# Patient Record
Sex: Male | Born: 1935 | Race: White | Hispanic: No | Marital: Married | Smoking: Former smoker
Health system: Southern US, Community
[De-identification: ages and names within clinical notes are randomized; demographics above are authoritative.]

## PROBLEM LIST (undated history)

## (undated) DIAGNOSIS — C859A Non-Hodgkin lymphoma, unspecified, in remission: Secondary | ICD-10-CM

## (undated) DIAGNOSIS — C8590 Non-Hodgkin lymphoma, unspecified, unspecified site: Secondary | ICD-10-CM

## (undated) DIAGNOSIS — E669 Obesity, unspecified: Secondary | ICD-10-CM

## (undated) DIAGNOSIS — C779 Secondary and unspecified malignant neoplasm of lymph node, unspecified: Secondary | ICD-10-CM

## (undated) DIAGNOSIS — M1990 Unspecified osteoarthritis, unspecified site: Secondary | ICD-10-CM

## (undated) DIAGNOSIS — G629 Polyneuropathy, unspecified: Secondary | ICD-10-CM

## (undated) DIAGNOSIS — H532 Diplopia: Secondary | ICD-10-CM

## (undated) DIAGNOSIS — Z923 Personal history of irradiation: Secondary | ICD-10-CM

## (undated) DIAGNOSIS — O368390 Maternal care for abnormalities of the fetal heart rate or rhythm, unspecified trimester, not applicable or unspecified: Secondary | ICD-10-CM

## (undated) DIAGNOSIS — H9190 Unspecified hearing loss, unspecified ear: Secondary | ICD-10-CM

## (undated) DIAGNOSIS — K620 Anal polyp: Secondary | ICD-10-CM

## (undated) DIAGNOSIS — H9319 Tinnitus, unspecified ear: Secondary | ICD-10-CM

## (undated) DIAGNOSIS — R42 Dizziness and giddiness: Secondary | ICD-10-CM

## (undated) DIAGNOSIS — K5790 Diverticulosis of intestine, part unspecified, without perforation or abscess without bleeding: Secondary | ICD-10-CM

## (undated) DIAGNOSIS — E7800 Pure hypercholesterolemia, unspecified: Secondary | ICD-10-CM

## (undated) DIAGNOSIS — C61 Malignant neoplasm of prostate: Secondary | ICD-10-CM

## (undated) HISTORY — DX: Polyneuropathy, unspecified: G62.9

## (undated) HISTORY — DX: Tinnitus, unspecified ear: H93.19

## (undated) HISTORY — DX: Obesity, unspecified: E66.9

## (undated) HISTORY — PX: OTHER SURGICAL HISTORY: SHX169

## (undated) HISTORY — DX: Diverticulosis of intestine, part unspecified, without perforation or abscess without bleeding: K57.90

## (undated) HISTORY — DX: Unspecified osteoarthritis, unspecified site: M19.90

## (undated) HISTORY — PX: CATARACT EXTRACTION: SUR2

## (undated) HISTORY — PX: TONSILLECTOMY: SUR1361

## (undated) HISTORY — DX: Anal polyp: K62.0

## (undated) HISTORY — DX: Personal history of irradiation: Z92.3

## (undated) HISTORY — DX: Maternal care for abnormalities of the fetal heart rate or rhythm, unspecified trimester, not applicable or unspecified: O36.8390

## (undated) HISTORY — PX: BACK SURGERY: SHX140

## (undated) HISTORY — DX: Diplopia: H53.2

## (undated) HISTORY — DX: Unspecified hearing loss, unspecified ear: H91.90

---

## 2000-04-06 ENCOUNTER — Ambulatory Visit (HOSPITAL_COMMUNITY): Admission: RE | Admit: 2000-04-06 | Discharge: 2000-04-06 | Payer: Self-pay | Admitting: *Deleted

## 2000-04-06 HISTORY — PX: COLONOSCOPY: SHX174

## 2005-04-05 ENCOUNTER — Emergency Department (HOSPITAL_COMMUNITY): Admission: EM | Admit: 2005-04-05 | Discharge: 2005-04-06 | Payer: Self-pay | Admitting: Emergency Medicine

## 2005-08-17 ENCOUNTER — Emergency Department (HOSPITAL_COMMUNITY): Admission: EM | Admit: 2005-08-17 | Discharge: 2005-08-17 | Payer: Self-pay | Admitting: Emergency Medicine

## 2007-08-21 ENCOUNTER — Encounter: Admission: RE | Admit: 2007-08-21 | Discharge: 2007-08-21 | Payer: Self-pay | Admitting: Internal Medicine

## 2009-08-18 ENCOUNTER — Ambulatory Visit: Payer: Self-pay | Admitting: Radiology

## 2009-08-18 ENCOUNTER — Emergency Department (HOSPITAL_BASED_OUTPATIENT_CLINIC_OR_DEPARTMENT_OTHER): Admission: EM | Admit: 2009-08-18 | Discharge: 2009-08-18 | Payer: Self-pay | Admitting: Emergency Medicine

## 2010-04-21 ENCOUNTER — Encounter
Admission: RE | Admit: 2010-04-21 | Discharge: 2010-04-21 | Payer: Self-pay | Source: Home / Self Care | Attending: Internal Medicine | Admitting: Internal Medicine

## 2010-08-07 NOTE — Procedures (Signed)
[place]. [facility]  Patient:    Darren Frank, Darren Frank                         MRN: [id number] Proc. Date: 04/06/00 Adm. Date:  [id number] Attending:  Sabino Gasser                           Procedure Report  PROCEDURE PERFORMED:  Colonoscopy.  ENDOSCOPIST:  Sabino Gasser, M.D.  INDICATIONS FOR PROCEDURE:  Colon cancer screening.  ANESTHESIA:  Demerol 70 mg, Versed 10 mg.  DESCRIPTION OF PROCEDURE:  With the patient mildly sedated in the left lateral decubitus position, a rectal exam was performed which was unremarkable. Subsequently the Olympus videoscopic colonoscope was inserted in the rectum and passed under direct vision into the cecum.  The cecum was identified by the ileocecal valve and appendiceal orifice, both of which were photographed. From this point, the colonoscope was slowly withdrawn, taking circumferential views of the entire colonic mucos, stopping in the sigmoid colon where diverticula were seen and more so than in the right colon, but were seen in both locations.  Photographs were taken in the sigmoid.  The endoscope was withdrawn to the rectum, which appeared normal on direct view and showed large internal hemorrhoids on retroflex view.  The endoscope was straightened and withdrawn.  Patients vital signs and pulse oximeter remained stable.  The patient tolerated the procedure well and without apparent complications.  FINDINGS:  Internal hemorrhoids, diverticulosis throughout colon, the left side more than right.  Otherwise unremarkable examination.  PLAN:  Follow up with me in five to 10 years or as needed. DD:  04/06/00 TD:  04/06/00 Job: [id number]

## 2010-08-07 NOTE — H&P (Signed)
NAME:  Darren, Frank NO.:  [id number]   MEDICAL RECORD NO.:  [id number]          PATIENT TYPE:  EMS   LOCATION:  ED                           FACILITY:  [facility]   PHYSICIAN:  Michaelyn Barter, M.D. DATE OF BIRTH:  [birth year]   DATE OF ADMISSION:  04/05/2005  DATE OF DISCHARGE:                                HISTORY & PHYSICAL   PRIMARY CARE PHYSICIAN:  Soyla Murphy. Renne Crigler, M.D.   CHIEF COMPLAINT:  Right hip swelling.   HISTORY OF PRESENT ILLNESS:  Darren Frank is a 77 year old male who states that  he was in the attic of his home earlier today when he fell through the roof  partially this morning.  He stated that he did not completely fall through  the roof.  However, his right leg did completely go through the roof.  He  managed to pull himself up and felt okay afterwards.  However shortly after  that, he went to clean his kitchen up and sometime later noticed that he had  began to develop a hematoma over the right lateral aspect of his thigh.  He  complains of only minimal pain at this particular time stating that certain  positions aggravate the pain.  Since the initial development of the  hematoma, it has continued to enlarge.  He therefore came to the ER for  further evaluation.  While waiting at the emergency departments waiting  department, he became diaphoretic, felt generalized pins and needles and  felt as though he was going to pass out.  He was then laid flat onto a  stretcher and given aggressive IV fluid hydration.  Currently, he states he  feels okay.  He has localized pain approximately 1-2/10 in intensity coming  from the area of the hematoma, no nausea, vomiting, fevers or chills, no  shortness of breath.   PAST MEDICAL HISTORY:  1.  Hyperlipidemia.  2.  Numbness in both feet currently being evaluated by Dr. Thad Ranger.  3.  High PSA treated by Dr. Isabel Caprice.  4.  Lymphoma in the left groin treated in the 1970s at [facility].  5.   Vertigo.  6.  Tinnitus.   PAST SURGICAL HISTORY:  Back surgery.   ALLERGIES:  No known drug allergies.   HOME MEDICATIONS:  1.  Multivitamin.  2. Aspirin 81 mg a day.  3. Vytorin.  4. Glucosamine.   FAMILY HISTORY:  Mother has a history of CVA and hypertension.  Father has a  history of Parkinson's disease.   SOCIAL HISTORY:  Cigarettes:  The patient stopped smoking back in the 1970s.  He smoked for approximately 20 years prior to that.  He smoked approximately  one pack per day.  Alcohol:  The patient drinks socially.   REVIEW OF SYSTEMS:  As per History of Present Illness, otherwise all other  symptoms are negative.   PHYSICAL EXAMINATION:  GENERAL:  The patient is currently awake.  He is  cooperative.  He does not appear to be in any obvious distress.  VITALS:  Blood pressure 113/65, heart  rate 54, respirations 20, oxygen  saturation is 97% on room air.  HEENT:  Anicteric.  Extraocular movements intact.  Oral mucosa is pink, no  thrush, no exudate.  NECK:  Supple, no lymphadenopathy.  Thyroid is not palpable.  No JVD.  CARDIAC:  S1, S2 is present, regular rate and rhythm.  No S3, S4, no murmur,  no gallops and no rubs.  RESPIRATORY:  Lungs are clear, no crackles, no wheezes.  ABDOMEN:  Soft, nontender, nondistended, positive bowel sounds x4 quadrants,  no masses palpable.  EXTREMITIES:  Trace distal leg edema of the right hip has a large hematoma  approximately 12 inches in length x 8 inches in width.  There is a large  abrasion that covers a large portion of the hematoma that is bright red.  NEUROLOGIC:  The patient is alert and oriented x3.  Cranial nerves II-XII  are intact.   LABORATORY DATA:  White blood cell count is 7.5, hemoglobin 13.9, hematocrit  40.2.  Platelet count 161.  Sodium 137, potassium 3.8, chloride 103, CO2 27,  BUN 14, creatinine 1.2, glucose 117, calcium 8.6.  X-ray of the right hip  reveals no acute findings.   ASSESSMENT/PLAN:  [name] is a  77 year old gentleman who sustained trauma  while working in his attic earlier today resulting in a hematoma on the  right aspect of his thigh.   1.  Right lateral thigh hematoma.  We will admit the patient for 23-hour      observation.  We will follow his H&Hs closely.  Continue aggressive IV      fluid hydration.  We will provide p.r.n. pain medication.  We will      consider transfusion if H&H starts to decline.  2.  History of hyperlipidemia.  We will continue previously prescribed home      medications.      Michaelyn Barter, M.D.  Electronically Signed     OR/MEDQ  D:  04/05/2005  T:  04/05/2005  Job:  [id number]   cc:   Soyla Murphy. Renne Crigler, M.D.  Fax: [phone]

## 2010-09-28 ENCOUNTER — Encounter: Payer: Self-pay | Admitting: *Deleted

## 2010-09-28 ENCOUNTER — Emergency Department (HOSPITAL_BASED_OUTPATIENT_CLINIC_OR_DEPARTMENT_OTHER)
Admission: EM | Admit: 2010-09-28 | Discharge: 2010-09-28 | Disposition: A | Discharge status: Home / Self Care | Payer: Medicare Other | Attending: Emergency Medicine | Admitting: Emergency Medicine

## 2010-09-28 DIAGNOSIS — R350 Frequency of micturition: Secondary | ICD-10-CM | POA: Insufficient documentation

## 2010-09-28 DIAGNOSIS — C779 Secondary and unspecified malignant neoplasm of lymph node, unspecified: Secondary | ICD-10-CM | POA: Insufficient documentation

## 2010-09-28 DIAGNOSIS — R300 Dysuria: Secondary | ICD-10-CM | POA: Insufficient documentation

## 2010-09-28 DIAGNOSIS — N390 Urinary tract infection, site not specified: Secondary | ICD-10-CM

## 2010-09-28 DIAGNOSIS — C61 Malignant neoplasm of prostate: Secondary | ICD-10-CM | POA: Insufficient documentation

## 2010-09-28 DIAGNOSIS — R319 Hematuria, unspecified: Secondary | ICD-10-CM | POA: Insufficient documentation

## 2010-09-28 HISTORY — DX: Secondary and unspecified malignant neoplasm of lymph node, unspecified: C77.9

## 2010-09-28 HISTORY — DX: Malignant neoplasm of prostate: C61

## 2010-09-28 LAB — URINE MICROSCOPIC-ADD ON

## 2010-09-28 LAB — URINALYSIS, ROUTINE W REFLEX MICROSCOPIC
Bilirubin Urine: NEGATIVE
Glucose, UA: NEGATIVE mg/dL
Protein, ur: >300 mg/dL — AB
Specific Gravity, Urine: 1.022 (ref 1.005–1.030)
Urobilinogen, UA: 0.2 mg/dL (ref 0.0–1.0)
pH: 6 (ref 5.0–8.0)

## 2010-09-28 MED ORDER — CIPROFLOXACIN HCL 500 MG PO TABS
500.0000 mg | ORAL_TABLET | Freq: Once | ORAL | Status: AC
  Administered 2010-09-28: 500 mg via ORAL
  Filled 2010-09-28: qty 1

## 2010-09-28 MED ORDER — CIPROFLOXACIN HCL 500 MG PO TABS: 500.0000 mg | ORAL_TABLET | Freq: Two times a day (BID) | ORAL | Status: AC

## 2010-09-28 NOTE — ED Notes (Signed)
Pt states that he has a hx of prostate ca and had markers placed in April pt returned for more testing on Thursday and is concerned that he has  UTI pt with hematuria painful burning sensation upon urination   And urinary frequency

## 2010-09-28 NOTE — ED Provider Notes (Signed)
History     Chief Complaint  Patient presents with  . Hematuria  . Dysuria  . Urinary Frequency   Patient is a 77 y.o. male presenting with hematuria, dysuria, and frequency. The history is provided by the patient.  Hematuria This is a new problem. Episode onset: four hours ago. He describes the hematuria as gross hematuria. The pain is moderate. Irritative symptoms include frequency and urgency. Obstructive symptoms include incomplete emptying and a slower stream. Associated symptoms include dysuria. Pertinent negatives include no abdominal pain, chills, fever, flank pain or vomiting.  Dysuria  Associated symptoms include frequency, hematuria and urgency. Pertinent negatives include no chills, no vomiting and no flank pain.  Urinary Frequency Pertinent negatives include no abdominal pain.    Past Medical History  Diagnosis Date  . Cancer   . Prostate cancer   . Lymph node cancer     History reviewed. No pertinent past surgical history.  History reviewed. No pertinent family history.  History  Substance Use Topics  . Smoking status: Never Smoker   . Smokeless tobacco: Not on file  . Alcohol Use:       Review of Systems  Constitutional: Negative for fever and chills.  Gastrointestinal: Negative for vomiting and abdominal pain.  Genitourinary: Positive for dysuria, urgency, frequency, hematuria and incomplete emptying. Negative for flank pain.  All other systems reviewed and are negative.    Physical Exam  BP 153/63  Pulse 68  Temp(Src) 98.7 F (37.1 C) (Oral)  Resp 16  Ht 6\' 2"  (1.88 m)  Wt 275 lb (124.739 kg)  BMI 35.31 kg/m2  SpO2 96%  Physical Exam  Nursing note and vitals reviewed. Constitutional: He appears well-developed and well-nourished.  HENT:  Head: Normocephalic and atraumatic.  Eyes: Conjunctivae and EOM are normal. Pupils are equal, round, and reactive to light.  Neck: Normal range of motion. Neck supple.  Cardiovascular: Normal rate and  regular rhythm.   Pulmonary/Chest: Effort normal and breath sounds normal.  Abdominal: Soft. He exhibits no distension and no mass. There is no tenderness. There is no CVA tenderness.  Genitourinary:       Urine grossly bloody  Musculoskeletal: Normal range of motion.       Trace edema of lower legs  Neurological: He is alert. Coordination normal.  Skin: Skin is warm and dry.    ED Course  Procedures  MDM Will treat for UTI. Advised increased fluid intake to prevent clots or obstruction.      Hanley Seamen, MD 09/28/10 [phone]

## 2010-09-29 LAB — URINE CULTURE: Culture  Setup Time: 201207091221

## 2010-10-09 ENCOUNTER — Ambulatory Visit: Payer: Medicare Other | Attending: Internal Medicine | Admitting: Rehabilitative and Restorative Service Providers"

## 2010-10-09 DIAGNOSIS — R269 Unspecified abnormalities of gait and mobility: Secondary | ICD-10-CM | POA: Insufficient documentation

## 2010-10-09 DIAGNOSIS — R42 Dizziness and giddiness: Secondary | ICD-10-CM | POA: Insufficient documentation

## 2010-10-09 DIAGNOSIS — IMO0001 Reserved for inherently not codable concepts without codable children: Secondary | ICD-10-CM | POA: Insufficient documentation

## 2011-04-03 ENCOUNTER — Encounter (HOSPITAL_BASED_OUTPATIENT_CLINIC_OR_DEPARTMENT_OTHER): Payer: Self-pay | Admitting: *Deleted

## 2011-04-03 ENCOUNTER — Inpatient Hospital Stay (HOSPITAL_BASED_OUTPATIENT_CLINIC_OR_DEPARTMENT_OTHER)
Admission: EM | Admit: 2011-04-03 | Discharge: 2011-04-05 | DRG: 864 | Disposition: A | Discharge status: Home / Self Care | Payer: Medicare Other | Attending: Family Medicine | Admitting: Family Medicine

## 2011-04-03 ENCOUNTER — Other Ambulatory Visit: Payer: Self-pay

## 2011-04-03 ENCOUNTER — Emergency Department (INDEPENDENT_AMBULATORY_CARE_PROVIDER_SITE_OTHER): Payer: Medicare Other

## 2011-04-03 DIAGNOSIS — Z79899 Other long term (current) drug therapy: Secondary | ICD-10-CM

## 2011-04-03 DIAGNOSIS — Z9849 Cataract extraction status, unspecified eye: Secondary | ICD-10-CM

## 2011-04-03 DIAGNOSIS — E7800 Pure hypercholesterolemia, unspecified: Secondary | ICD-10-CM | POA: Diagnosis present

## 2011-04-03 DIAGNOSIS — R05 Cough: Secondary | ICD-10-CM

## 2011-04-03 DIAGNOSIS — D709 Neutropenia, unspecified: Secondary | ICD-10-CM | POA: Diagnosis present

## 2011-04-03 DIAGNOSIS — R509 Fever, unspecified: Secondary | ICD-10-CM

## 2011-04-03 DIAGNOSIS — R5383 Other fatigue: Secondary | ICD-10-CM

## 2011-04-03 DIAGNOSIS — D696 Thrombocytopenia, unspecified: Secondary | ICD-10-CM | POA: Diagnosis present

## 2011-04-03 DIAGNOSIS — R5381 Other malaise: Secondary | ICD-10-CM | POA: Diagnosis present

## 2011-04-03 DIAGNOSIS — C8589 Other specified types of non-Hodgkin lymphoma, extranodal and solid organ sites: Secondary | ICD-10-CM | POA: Diagnosis present

## 2011-04-03 DIAGNOSIS — Z7982 Long term (current) use of aspirin: Secondary | ICD-10-CM

## 2011-04-03 DIAGNOSIS — T451X5A Adverse effect of antineoplastic and immunosuppressive drugs, initial encounter: Secondary | ICD-10-CM | POA: Diagnosis present

## 2011-04-03 DIAGNOSIS — I517 Cardiomegaly: Secondary | ICD-10-CM

## 2011-04-03 DIAGNOSIS — Z87891 Personal history of nicotine dependence: Secondary | ICD-10-CM

## 2011-04-03 DIAGNOSIS — C61 Malignant neoplasm of prostate: Secondary | ICD-10-CM | POA: Diagnosis present

## 2011-04-03 HISTORY — DX: Non-Hodgkin lymphoma, unspecified, in remission: C85.9A

## 2011-04-03 HISTORY — DX: Pure hypercholesterolemia, unspecified: E78.00

## 2011-04-03 HISTORY — DX: Dizziness and giddiness: R42

## 2011-04-03 HISTORY — DX: Non-Hodgkin lymphoma, unspecified, unspecified site: C85.90

## 2011-04-03 LAB — CK: Total CK: 172 U/L (ref 7–232)

## 2011-04-03 LAB — COMPREHENSIVE METABOLIC PANEL
ALT: 12 U/L (ref 0–53)
AST: 39 U/L — ABNORMAL HIGH (ref 0–37)
Alkaline Phosphatase: 38 U/L — ABNORMAL LOW (ref 39–117)
Chloride: 97 mEq/L (ref 96–112)
GFR calc non Af Amer: 80 mL/min — ABNORMAL LOW (ref >90)
Total Bilirubin: 0.7 mg/dL (ref 0.3–1.2)

## 2011-04-03 LAB — DIFFERENTIAL
Basophils Absolute: 0 10*3/uL (ref 0.0–0.1)
Basophils Relative: 0 % (ref 0–1)
Eosinophils Absolute: 0 10*3/uL (ref 0.0–0.7)
Lymphs Abs: 0.2 10*3/uL — ABNORMAL LOW (ref 0.7–4.0)
Monocytes Absolute: 0.2 10*3/uL (ref 0.1–1.0)
Monocytes Relative: 6 % (ref 3–12)
Neutro Abs: 2.4 10*3/uL (ref 1.7–7.7)

## 2011-04-03 LAB — CULTURE, BLOOD (ROUTINE X 2)
Culture: NO GROWTH
Culture: NO GROWTH

## 2011-04-03 LAB — URINE MICROSCOPIC-ADD ON

## 2011-04-03 LAB — URINALYSIS, ROUTINE W REFLEX MICROSCOPIC
Glucose, UA: NEGATIVE mg/dL
Leukocytes, UA: NEGATIVE
Specific Gravity, Urine: 1.029 (ref 1.005–1.030)
pH: 5.5 (ref 5.0–8.0)

## 2011-04-03 LAB — CBC
MCV: 90.3 fL (ref 78.0–100.0)
Platelets: 83 10*3/uL — ABNORMAL LOW (ref 150–400)

## 2011-04-03 LAB — URINE CULTURE
Colony Count: 10000
Culture  Setup Time: 201301131209

## 2011-04-03 LAB — LACTIC ACID, PLASMA: Lactic Acid, Venous: 1.4 mmol/L (ref 0.5–2.2)

## 2011-04-03 MED ORDER — OXYCODONE HCL 5 MG PO TABS: 5.0000 mg | ORAL_TABLET | ORAL | Status: DC | PRN

## 2011-04-03 MED ORDER — VANCOMYCIN HCL IN DEXTROSE 1-5 GM/200ML-% IV SOLN
1000.0000 mg | Freq: Once | INTRAVENOUS | Status: AC
  Administered 2011-04-03: 1000 mg via INTRAVENOUS
  Filled 2011-04-03: qty 200

## 2011-04-03 MED ORDER — ALUM & MAG HYDROXIDE-SIMETH 200-200-20 MG/5ML PO SUSP: 30.0000 mL | Freq: Four times a day (QID) | ORAL | Status: DC | PRN

## 2011-04-03 MED ORDER — ACETAMINOPHEN 500 MG PO TABS
1000.0000 mg | ORAL_TABLET | Freq: Once | ORAL | Status: AC
  Administered 2011-04-03: 1000 mg via ORAL
  Filled 2011-04-03: qty 2

## 2011-04-03 MED ORDER — ZOLPIDEM TARTRATE 5 MG PO TABS: 5.0000 mg | ORAL_TABLET | Freq: Every evening | ORAL | Status: DC | PRN

## 2011-04-03 MED ORDER — SODIUM CHLORIDE 0.9 % IV BOLUS (SEPSIS)
2000.0000 mL | Freq: Once | INTRAVENOUS | Status: AC
  Administered 2011-04-03: 2000 mL via INTRAVENOUS

## 2011-04-03 MED ORDER — HYDROMORPHONE HCL PF 1 MG/ML IJ SOLN: 0.5000 mg | INTRAMUSCULAR | Status: DC | PRN

## 2011-04-03 MED ORDER — PIPERACILLIN-TAZOBACTAM 3.375 G IVPB
3.3750 g | Freq: Three times a day (TID) | INTRAVENOUS | Status: DC
  Administered 2011-04-04 (×2): 3.375 g via INTRAVENOUS
  Filled 2011-04-03 (×4): qty 50

## 2011-04-03 MED ORDER — ONDANSETRON HCL 4 MG PO TABS: 4.0000 mg | ORAL_TABLET | Freq: Four times a day (QID) | ORAL | Status: DC | PRN

## 2011-04-03 MED ORDER — SODIUM CHLORIDE 0.9 % IV SOLN
INTRAVENOUS | Status: DC
  Administered 2011-04-04 – 2011-04-05 (×3): via INTRAVENOUS

## 2011-04-03 MED ORDER — SODIUM CHLORIDE 0.9 % IV SOLN
Freq: Once | INTRAVENOUS | Status: AC
  Administered 2011-04-03: 17:00:00 via INTRAVENOUS

## 2011-04-03 MED ORDER — ACETAMINOPHEN 650 MG RE SUPP: 650.0000 mg | Freq: Four times a day (QID) | RECTAL | Status: DC | PRN

## 2011-04-03 MED ORDER — SODIUM CHLORIDE 0.9 % IV SOLN: INTRAVENOUS | Status: AC

## 2011-04-03 MED ORDER — ONDANSETRON HCL 4 MG/2ML IJ SOLN: 4.0000 mg | Freq: Four times a day (QID) | INTRAMUSCULAR | Status: DC | PRN

## 2011-04-03 MED ORDER — ACETAMINOPHEN 325 MG PO TABS: 650.0000 mg | ORAL_TABLET | Freq: Four times a day (QID) | ORAL | Status: DC | PRN

## 2011-04-03 MED ORDER — PIPERACILLIN-TAZOBACTAM 3.375 G IVPB
3.3750 g | Freq: Once | INTRAVENOUS | Status: AC
  Administered 2011-04-03: 3.375 g via INTRAVENOUS
  Filled 2011-04-03: qty 50

## 2011-04-03 MED ORDER — VANCOMYCIN HCL IN DEXTROSE 1-5 GM/200ML-% IV SOLN
1000.0000 mg | Freq: Two times a day (BID) | INTRAVENOUS | Status: DC
  Administered 2011-04-04 (×2): 1000 mg via INTRAVENOUS
  Filled 2011-04-03 (×3): qty 200

## 2011-04-03 NOTE — ED Notes (Signed)
EMS transport from home- reported to ems he was having difficulty with his balance last night- hx of vertigo- today also c/o generalized weakness

## 2011-04-03 NOTE — ED Provider Notes (Signed)
History  This chart was scribed for Darren Sou, MD by Darren Frank. This patient was seen in room MH06/MH06 and the patient's care was started at 4:03PM.  CSN: [id number]  Arrival date & time 04/03/11  1534   First MD Initiated Contact with Patient 04/03/11 1559      Chief Complaint  Patient presents with  . Weakness    Patient is a 78 y.o. male presenting with weakness. The history is provided by the patient. No language interpreter was used.  Weakness The primary symptoms include fever. Primary symptoms do not include headaches, syncope, loss of consciousness, altered mental status, dizziness, loss of sensation, nausea or vomiting. The symptoms began yesterday. The symptoms are unchanged.  Additional symptoms include weakness. Additional symptoms do not include neck stiffness, pain, leg pain, loss of balance, tinnitus or vertigo.    Darren Frank is a 78 y.o. male brought in by ambulance, who presents to the Emergency Department complaining of gradual onset, non-changing, constant weakness that began yesterday. Pt states that he couldn't get out of his lounge chair yesterday and felt weakness while sitting up and attempting to stand. He reports that he got up today at 8:30AM and then slept until the paramedics arrived about 30 minutes prior to arrival. Per family, pt was wheezing this morning. Pt has a fever of 102.7 in the ED. Pt states that the symptoms are worsened by exertion and improved with rest. He did not take any medications to improve his symptoms. He denies cough, SOB,  vomiting, and diarrhea as associated symptoms. He has a h/o cancer and was treated over the summer last year for prostate cancer at [facility]. He denies smoking and is an occasional alcohol user.  PCP is Dr. Ander Frank.  Past Medical History  Diagnosis Date  . Cancer   . Prostate cancer   . Lymph node cancer   . Vertigo   . High cholesterol   . Lymphoma in remission     Past Surgical History    Procedure Date  . Cataract extraction     No family history on file.  History  Substance Use Topics  . Smoking status: Former Games developer  . Smokeless tobacco: Never Used  . Alcohol Use: Not on file  Pt drinks a glass of wine a week    Review of Systems  Constitutional: Positive for fever. Negative for diaphoresis.  HENT: Negative for sore throat, neck stiffness and tinnitus.   Respiratory: Negative for cough and shortness of breath.   Cardiovascular: Negative for chest pain and syncope.  Gastrointestinal: Negative for nausea, vomiting and diarrhea.  Genitourinary: Negative for dysuria and hematuria.  Musculoskeletal: Negative for back pain.  Skin: Negative for rash.  Neurological: Positive for weakness. Negative for dizziness, vertigo, loss of consciousness, light-headedness, headaches and loss of balance.  Psychiatric/Behavioral: Negative for altered mental status.  All other systems reviewed and are negative.    Allergies  Review of patient's allergies indicates no known allergies.  Home Medications   Current Outpatient Rx  Name Route Sig Dispense Refill  . VITAMIN D 2000 UNITS PO TABS Oral Take 2,000 Units by mouth daily.    Marland Kitchen GLUCOSAMINE HCL 1500 MG PO TABS Oral Take 3,000 mg by mouth daily.    . ADULT MULTIVITAMIN W/MINERALS CH Oral Take 1 tablet by mouth daily.    Marland Kitchen FISH OIL TRIPLE STRENGTH 1400 MG PO CAPS Oral Take 1 capsule by mouth daily.    Marland Kitchen TAMSULOSIN HCL 0.4 MG PO CAPS  Oral Take 0.4 mg by mouth.    . ASPIRIN 81 MG PO TABS Oral Take 81 mg by mouth daily.      Marland Kitchen EZETIMIBE 10 MG PO TABS Oral Take 10 mg by mouth daily.      Marland Kitchen ROSUVASTATIN CALCIUM 20 MG PO TABS Oral Take 20 mg by mouth daily.        Triage Vitals: BP 152/51  Pulse 77  Temp(Src) 102.7 F (39.3 C) (Oral)  Resp 20  Ht 6\' 2"  (1.88 m)  Wt 275 lb (124.739 kg)  BMI 35.31 kg/m2  SpO2 95%  Physical Exam  Nursing note and vitals reviewed. Constitutional: He is oriented to person, place, and time.  He appears well-developed and well-nourished.  HENT:  Head: Normocephalic and atraumatic.       Dry mucous membranes  Eyes: Conjunctivae and EOM are normal.  Neck: Neck supple.       No bruit  Pulmonary/Chest: Effort normal and breath sounds normal. No respiratory distress.  Abdominal: Soft. There is no tenderness.       Ventral hernia  Genitourinary: Prostate normal and penis normal.  Musculoskeletal: Normal range of motion. He exhibits no edema (no leg swelling).       Weak upon sitting up  Lymphadenopathy:    He has no cervical adenopathy.  Neurological: He is alert and oriented to person, place, and time. No cranial nerve deficit.  Skin: Skin is warm and dry. No rash noted.  Psychiatric: He has a normal mood and affect. His behavior is normal.    ED Course  Procedures (including critical care time)  DIAGNOSTIC STUDIES: Oxygen Saturation is 95% on room air, adequate by my interpretation.    COORDINATION OF CARE: 4:07PM-Advised pt that he will be admitted and he agreed to admission. Discussed treatment plan with pt at bedside and pt agreed to plan.   Date: 04/03/2011  Rate: 75  Rhythm: normal sinus rhythm  QRS Axis: normal  Intervals: normal  ST/T Wave abnormalities: normal  Conduction Disutrbances:none  Narrative Interpretation:   Old EKG Reviewed: none available Diffuse low-voltage QRS complexes  No diagnosis found. Results for orders placed during the hospital encounter of 04/03/11  CBC      Component Value Range   WBC 2.8 (*) 4.0 - 10.5 (K/uL)   RBC 4.53  4.22 - 5.81 (MIL/uL)   Hemoglobin 14.0  13.0 - 17.0 (g/dL)   HCT 60.4  54.0 - 98.1 (%)   MCV 90.3  78.0 - 100.0 (fL)   MCH 30.9  26.0 - 34.0 (pg)   MCHC 34.2  30.0 - 36.0 (g/dL)   RDW 19.1  47.8 - 29.5 (%)   Platelets 83 (*) 150 - 400 (K/uL)  DIFFERENTIAL      Component Value Range   Neutrophils Relative 87 (*) 43 - 77 (%)   Neutro Abs 2.4  1.7 - 7.7 (K/uL)   Lymphocytes Relative 7 (*) 12 - 46 (%)    Lymphs Abs 0.2 (*) 0.7 - 4.0 (K/uL)   Monocytes Relative 6  3 - 12 (%)   Monocytes Absolute 0.2  0.1 - 1.0 (K/uL)   Eosinophils Relative 1  0 - 5 (%)   Eosinophils Absolute 0.0  0.0 - 0.7 (K/uL)   Basophils Relative 0  0 - 1 (%)   Basophils Absolute 0.0  0.0 - 0.1 (K/uL)   RBC Morphology POLYCHROMASIA PRESENT    COMPREHENSIVE METABOLIC PANEL      Component Value Range  Sodium 134 (*) 135 - 145 (mEq/L)   Potassium 4.1  3.5 - 5.1 (mEq/L)   Chloride 97  96 - 112 (mEq/L)   CO2 26  19 - 32 (mEq/L)   Glucose, Bld 103 (*) 70 - 99 (mg/dL)   BUN 14  6 - 23 (mg/dL)   Creatinine, Ser 1.61  0.50 - 1.35 (mg/dL)   Calcium 9.0  8.4 - 09.6 (mg/dL)   Total Protein 7.2  6.0 - 8.3 (g/dL)   Albumin 4.0  3.5 - 5.2 (g/dL)   AST 39 (*) 0 - 37 (U/L)   ALT 12  0 - 53 (U/L)   Alkaline Phosphatase 38 (*) 39 - 117 (U/L)   Total Bilirubin 0.7  0.3 - 1.2 (mg/dL)   GFR calc non Af Amer 80 (*) >90 (mL/min)   GFR calc Af Amer >90  >90 (mL/min)  URINALYSIS, ROUTINE W REFLEX MICROSCOPIC      Component Value Range   Color, Urine AMBER (*) YELLOW    APPearance CLOUDY (*) CLEAR    Specific Gravity, Urine 1.029  1.005 - 1.030    pH 5.5  5.0 - 8.0    Glucose, UA NEGATIVE  NEGATIVE (mg/dL)   Hgb urine dipstick MODERATE (*) NEGATIVE    Bilirubin Urine NEGATIVE  NEGATIVE    Ketones, ur NEGATIVE  NEGATIVE (mg/dL)   Protein, ur 045 (*) NEGATIVE (mg/dL)   Urobilinogen, UA 0.2  0.0 - 1.0 (mg/dL)   Nitrite NEGATIVE  NEGATIVE    Leukocytes, UA NEGATIVE  NEGATIVE   LACTIC ACID, PLASMA      Component Value Range   Lactic Acid, Venous 1.4  0.5 - 2.2 (mmol/L)  URINE MICROSCOPIC-ADD ON      Component Value Range   Squamous Epithelial / LPF RARE  RARE    WBC, UA 3-6  <3 (WBC/hpf)   RBC / HPF 7-10  <3 (RBC/hpf)   Bacteria, UA MANY (*) RARE    Urine-Other MUCOUS PRESENT     Dg Chest Port 1 View  04/03/2011  *RADIOLOGY REPORT*  Clinical Data: Weakness.  Cough.  Fever.  PORTABLE CHEST - 1 VIEW  Comparison: 08/18/2009   Findings: Remote right rib trauma.  Mild cardiomegaly.  Left hemidiaphragm elevation persists.  No right and no definite left pleural effusion. No pneumothorax.  Right lung is clear.  Vague opacity at the left lung base.  IMPRESSION:  1.  Cardiomegaly with low lung volumes and left hemidiaphragm elevation. 2.  Vague density along the periphery of the left hemidiaphragm. Minimal airspace disease and/or pleural fluid cannot be entirely excluded.  Especially if there is a concern of left lower lobe pneumonia, consider PA and lateral radiographs.  Original Report Authenticated By: Consuello Bossier, M.D.     Patient becomes generally weak upon sitting up in bed  6:05 PM patient alert awake appears comfortable feels improved after treatment with intavenouvenous fluids and antibiotics  MDM  Plan IV fluids pan culture antibiotics Admission Assessment doubt pneumonia no cough lungs clear auscultation   Spoke with dr Sharl Ma who accepts pt in transfer to [facility] Team [facility] , med surg flood 23 hour obs  diagnosis #1 febrile illness #2 weakness #3 leukopenia #4 thrombocytopenia  I personally performed the services described in this documentation, which was scribed in my presence. The recorded information has been reviewed and considered.  Darren Sou, MD 04/03/11 [phone]

## 2011-04-03 NOTE — H&P (Addendum)
DATE OF ADMISSION:  04/03/2011  PCP:    Londell Moh, MD, MD   Chief Complaint: Weakness    HPI: Darren Frank is an 78 y.o. male who was seen at the [facility] ED for worsening weakness over the past 3 days. Patient reports low grade fevers and chills but he denies cough and myalgias.  He reports not being able to get out of his chair today and  Having difficulty walking dur to weakness.  He denies SOB or Chest Pain. He also denies having nausea or vomiting, but he reports having a decreased appetite.  Patient has a remote history of Lymphoma treated with chemo therapy in the 1970's, and a recent history of prostate cancer which was treated with radiation treatment at [facility] and was completed in 12/2010.  Patient reports having the flu vaccine 1 month ago.    Past Medical History  Diagnosis Date  . Cancer   . Prostate cancer   . Lymph node cancer   . Vertigo   . High cholesterol   . Lymphoma in remission     Past Surgical History  Procedure Date  . Cataract extraction     Medications:  HOME MEDS: Prior to Admission medications   Medication Sig Start Date End Date Taking? Authorizing Provider  aspirin 81 MG tablet Take 81 mg by mouth daily.     Yes Historical Provider, MD  Cholecalciferol (VITAMIN D) 2000 UNITS tablet Take 2,000 Units by mouth daily.   Yes Historical Provider, MD  ezetimibe (ZETIA) 10 MG tablet Take 10 mg by mouth daily.     Yes Historical Provider, MD  Glucosamine HCl (GLUCOSAMINE MAXIMUM STRENGTH) 1500 MG TABS Take 3,000 mg by mouth daily.   Yes Historical Provider, MD  Multiple Vitamin (MULITIVITAMIN WITH MINERALS) TABS Take 1 tablet by mouth daily.   Yes Historical Provider, MD  naproxen sodium (ANAPROX) 220 MG tablet Take 220 mg by mouth 2 (two) times daily with a meal. For back pain   Yes Historical Provider, MD  Omega-3 Fatty Acids (FISH OIL TRIPLE STRENGTH) 1400 MG CAPS Take 2 capsules by mouth daily.    Yes  Historical Provider, MD  rosuvastatin (CRESTOR) 20 MG tablet Take 20 mg by mouth daily.     Yes Historical Provider, MD  Tamsulosin HCl (FLOMAX) 0.4 MG CAPS Take 0.4 mg by mouth.   Yes Historical Provider, MD    Allergies:  No Known Allergies  Social History:   reports that he has quit smoking. He has never used smokeless tobacco. He reports that he does not use illicit drugs. His alcohol history not on file.  Family History: No family history on file.  Review of Systems:  The patient denies weight loss, vision loss, decreased hearing, hoarseness, chest pain, syncope, dyspnea on exertion, peripheral edema, balance deficits, hemoptysis, abdominal pain, melena, hematochezia, severe indigestion/heartburn, hematuria, incontinence, genital sores, muscle, suspicious skin lesions, transient blindness, depression, unusual weight change, abnormal bleeding, enlarged lymph nodes, angioedema, and breast masses.   Physical Exam:  GEN:  Pleasant  Elderly Obese 78 year old Caucasian male examined and in no acute distress; cooperative with exam Filed Vitals:   04/03/11 1713 04/03/11 1808 04/03/11 1938 04/03/11 2051  BP: 160/55 130/60 141/87   Pulse: 71 63 67   Temp: 101.4 F (38.6 C)  99.3 F (37.4 C)   TempSrc: Oral  Oral   Resp: 18 20 16    Height:      Weight:  130.409 kg (287 lb 8 oz)  SpO2: 98% 97% 97%    Blood pressure 141/87, pulse 67, temperature 99.3 F (37.4 C), temperature source Oral, resp. rate 16, height 6\' 2"  (1.88 m), weight 130.409 kg (287 lb 8 oz), SpO2 97.00%. PSYCH:  He is alert and oriented x4; does not appear anxious does not appear depressed; affect is normal HEENT: Normocephalic and Atraumatic, Mucous membranes pink; PERRLA; EOM intact; Fundi:  Benign;  No scleral icterus, Nares: Patent, Oropharynx: Clear, Neck:  FROM, no cervical lymphadenopathy nor thyromegaly or carotid bruit; no JVD; Breasts:: Not examined CHEST WALL: No tenderness CHEST: Normal respiration, clear  to auscultation bilaterally HEART: Regular rate and rhythm; no murmurs rubs or gallops BACK: No kyphosis or scoliosis; no CVA tenderness ABDOMEN: Positive Bowel Sounds, Obese, soft non-tender; no masses, no organomegaly. Rectal Exam: Not done EXTREMITIES: no cyanosis, clubbing or edema; no ulcerations. Genitalia: not examined PULSES: 2+ and symmetric SKIN: Normal hydration no rash or ulceration CNS: Cranial nerves 2-12 grossly intact no focal neurologic deficit, Gait not assessed.     Labs & Imaging Results for orders placed during the hospital encounter of 04/03/11 (from the past 48 hour(s))  CBC     Status: Abnormal   Collection Time   04/03/11  4:10 PM      Component Value Range Comment   WBC 2.8 (*) 4.0 - 10.5 (K/uL)    RBC 4.53  4.22 - 5.81 (MIL/uL)    Hemoglobin 14.0  13.0 - 17.0 (g/dL)    HCT 91.4  78.2 - 95.6 (%)    MCV 90.3  78.0 - 100.0 (fL)    MCH 30.9  26.0 - 34.0 (pg)    MCHC 34.2  30.0 - 36.0 (g/dL)    RDW 21.3  08.6 - 57.8 (%)    Platelets 83 (*) 150 - 400 (K/uL) PLATELET COUNT CONFIRMED BY SMEAR  DIFFERENTIAL     Status: Abnormal   Collection Time   04/03/11  4:10 PM      Component Value Range Comment   Neutrophils Relative 87 (*) 43 - 77 (%)    Neutro Abs 2.4  1.7 - 7.7 (K/uL)    Lymphocytes Relative 7 (*) 12 - 46 (%)    Lymphs Abs 0.2 (*) 0.7 - 4.0 (K/uL)    Monocytes Relative 6  3 - 12 (%)    Monocytes Absolute 0.2  0.1 - 1.0 (K/uL)    Eosinophils Relative 1  0 - 5 (%)    Eosinophils Absolute 0.0  0.0 - 0.7 (K/uL)    Basophils Relative 0  0 - 1 (%)    Basophils Absolute 0.0  0.0 - 0.1 (K/uL)    RBC Morphology POLYCHROMASIA PRESENT   BASOPHILIC STIPPLING  COMPREHENSIVE METABOLIC PANEL     Status: Abnormal   Collection Time   04/03/11  4:10 PM      Component Value Range Comment   Sodium 134 (*) 135 - 145 (mEq/L)    Potassium 4.1  3.5 - 5.1 (mEq/L)    Chloride 97  96 - 112 (mEq/L)    CO2 26  19 - 32 (mEq/L)    Glucose, Bld 103 (*) 70 - 99 (mg/dL)    BUN  14  6 - 23 (mg/dL)    Creatinine, Ser 4.69  0.50 - 1.35 (mg/dL)    Calcium 9.0  8.4 - 10.5 (mg/dL)    Total Protein 7.2  6.0 - 8.3 (g/dL)    Albumin 4.0  3.5 -  5.2 (g/dL)    AST 39 (*) 0 - 37 (U/L)    ALT 12  0 - 53 (U/L)    Alkaline Phosphatase 38 (*) 39 - 117 (U/L)    Total Bilirubin 0.7  0.3 - 1.2 (mg/dL)    GFR calc non Af Amer 80 (*) >90 (mL/min)    GFR calc Af Amer >90  >90 (mL/min)   CK     Status: Normal   Collection Time   04/03/11  4:10 PM      Component Value Range Comment   Total CK 172  7 - 232 (U/L)   LACTIC ACID, PLASMA     Status: Normal   Collection Time   04/03/11  4:45 PM      Component Value Range Comment   Lactic Acid, Venous 1.4  0.5 - 2.2 (mmol/L)   URINALYSIS, ROUTINE W REFLEX MICROSCOPIC     Status: Abnormal   Collection Time   04/03/11  5:02 PM      Component Value Range Comment   Color, Urine AMBER (*) YELLOW  BIOCHEMICALS MAY BE AFFECTED BY COLOR   APPearance CLOUDY (*) CLEAR     Specific Gravity, Urine 1.029  1.005 - 1.030     pH 5.5  5.0 - 8.0     Glucose, UA NEGATIVE  NEGATIVE (mg/dL)    Hgb urine dipstick MODERATE (*) NEGATIVE     Bilirubin Urine NEGATIVE  NEGATIVE     Ketones, ur NEGATIVE  NEGATIVE (mg/dL)    Protein, ur 161 (*) NEGATIVE (mg/dL)    Urobilinogen, UA 0.2  0.0 - 1.0 (mg/dL)    Nitrite NEGATIVE  NEGATIVE     Leukocytes, UA NEGATIVE  NEGATIVE    URINE MICROSCOPIC-ADD ON     Status: Abnormal   Collection Time   04/03/11  5:02 PM      Component Value Range Comment   Squamous Epithelial / LPF RARE  RARE     WBC, UA 3-6  <3 (WBC/hpf)    RBC / HPF 7-10  <3 (RBC/hpf)    Bacteria, UA MANY (*) RARE     Urine-Other MUCOUS PRESENT   SPERM PRESENT   Dg Chest Port 1 View  04/03/2011  *RADIOLOGY REPORT*  Clinical Data: Weakness.  Cough.  Fever.  PORTABLE CHEST - 1 VIEW  Comparison: 08/18/2009  Findings: Remote right rib trauma.  Mild cardiomegaly.  Left hemidiaphragm elevation persists.  No right and no definite left pleural effusion. No  pneumothorax.  Right lung is clear.  Vague opacity at the left lung base.  IMPRESSION:  1.  Cardiomegaly with low lung volumes and left hemidiaphragm elevation. 2.  Vague density along the periphery of the left hemidiaphragm. Minimal airspace disease and/or pleural fluid cannot be entirely excluded.  Especially if there is a concern of left lower lobe pneumonia, consider PA and lateral radiographs.  Original Report Authenticated By: Consuello Bossier, M.D.    Assessment/Plan: 1.  Weakness 2.  Fever 3.  Pneumonia LLL 4.  Neutropenia 5.  Thrombocytopenia 6.  Prostate Cancer History 7.  Remote History of Lymphoma    Plan:  Patient has been placed on Droplet precautions due to possible Pneumonia and also because of his neutropenia.  Broad Spectrum Antibiotic coverage has also been initaited as well with IV Vancomycn and Zosyn.  A repeat CXR will be performed in the AM, a PA+Lateral CXR for further evaluation of a Possible LLL Pneumonia.  Further imaging will be considered by the  primary rounding team if needed. SCDs have been ordered for  DVT prophylaxis.  Patients CPK levels are not elevated at this time but a recheck will be done in the AM.  His Statin medications will continue unless it is decided by the primary rounding team to discontinue because of his weakness.  Neurologic checks have also been ordered to evaluate for progression of the patients weakness, as well as continuous pulse oximetry to monitor for desaturation in the event patient may be developing GBS (Guilaain Barre Syndrome).  Other plans as per orders.    CODE STATUS:      FULL CODE         Davell Beckstead C 04/03/2011, 10:41 PM

## 2011-04-03 NOTE — ED Provider Notes (Signed)
History     CSN: [id number]  Arrival date & time 04/03/11  1534   First MD Initiated Contact with Patient 04/03/11 1559      Chief Complaint  Patient presents with  . Weakness    (Consider location/radiation/quality/duration/timing/severity/associated sxs/prior treatment) HPI  Past Medical History  Diagnosis Date  . Cancer   . Prostate cancer   . Lymph node cancer   . Vertigo   . High cholesterol   . Lymphoma in remission     Past Surgical History  Procedure Date  . Cataract extraction     No family history on file.  History  Substance Use Topics  . Smoking status: Former Games developer  . Smokeless tobacco: Never Used  . Alcohol Use: Not on file      Review of Systems  Allergies  Review of patient's allergies indicates no known allergies.  Home Medications   Current Outpatient Rx  Name Route Sig Dispense Refill  . VITAMIN D 2000 UNITS PO TABS Oral Take 2,000 Units by mouth daily.    Marland Kitchen GLUCOSAMINE HCL 1500 MG PO TABS Oral Take 3,000 mg by mouth daily.    . ADULT MULTIVITAMIN W/MINERALS CH Oral Take 1 tablet by mouth daily.    Marland Kitchen FISH OIL TRIPLE STRENGTH 1400 MG PO CAPS Oral Take 1 capsule by mouth daily.    Marland Kitchen TAMSULOSIN HCL 0.4 MG PO CAPS Oral Take 0.4 mg by mouth.    . ASPIRIN 81 MG PO TABS Oral Take 81 mg by mouth daily.      Marland Kitchen EZETIMIBE 10 MG PO TABS Oral Take 10 mg by mouth daily.      Marland Kitchen ROSUVASTATIN CALCIUM 20 MG PO TABS Oral Take 20 mg by mouth daily.        BP 152/51  Pulse 77  Temp(Src) 102.7 F (39.3 C) (Oral)  Resp 20  Ht 6\' 2"  (1.88 m)  Wt 275 lb (124.739 kg)  BMI 35.31 kg/m2  SpO2 95%  Physical Exam  ED Course  Procedures (including critical care time)  Labs Reviewed - No data to display No results found.   No diagnosis found.    MDM  Duplicate note        [name], MD 04/04/11 1308

## 2011-04-03 NOTE — Progress Notes (Signed)
ANTIBIOTIC CONSULT NOTE - INITIAL  Pharmacy Consult for Vancomycin and Zosyn Indication: PNA  No Known Allergies  Patient Measurements: Height: 6\' 2"  (188 cm) Weight: 287 lb 8 oz (130.409 kg) (wt  in  bed) IBW/kg (Calculated) : 82.2    Vital Signs: Temp: 99.3 F (37.4 C) (01/12 1938) Temp src: Oral (01/12 1938) BP: 141/87 mmHg (01/12 1938) Pulse Rate: 67  (01/12 1938)  Labs:  Basename 04/03/11 1610  WBC 2.8*  HGB 14.0  PLT 83*  LABCREA --  CREATININE 0.90   Estimated Creatinine Clearance: 98.7 ml/min (by C-G formula based on Cr of 0.9). No results found for this basename: VANCOTROUGH:2,VANCOPEAK:2,VANCORANDOM:2,GENTTROUGH:2,GENTPEAK:2,GENTRANDOM:2,TOBRATROUGH:2,TOBRAPEAK:2,TOBRARND:2,AMIKACINPEAK:2,AMIKACINTROU:2,AMIKACIN:2, in the last 72 hours   Medical History: Past Medical History  Diagnosis Date  . Cancer   . Prostate cancer   . Lymph node cancer   . Vertigo   . High cholesterol   . Lymphoma in remission     Medications:  Prescriptions prior to admission  Medication Sig Dispense Refill  . aspirin 81 MG tablet Take 81 mg by mouth daily.        . Cholecalciferol (VITAMIN D) 2000 UNITS tablet Take 2,000 Units by mouth daily.      Marland Kitchen ezetimibe (ZETIA) 10 MG tablet Take 10 mg by mouth daily.        . Glucosamine HCl (GLUCOSAMINE MAXIMUM STRENGTH) 1500 MG TABS Take 3,000 mg by mouth daily.      . Multiple Vitamin (MULITIVITAMIN WITH MINERALS) TABS Take 1 tablet by mouth daily.      . naproxen sodium (ANAPROX) 220 MG tablet Take 220 mg by mouth 2 (two) times daily with a meal. For back pain      . Omega-3 Fatty Acids (FISH OIL TRIPLE STRENGTH) 1400 MG CAPS Take 2 capsules by mouth daily.       . rosuvastatin (CRESTOR) 20 MG tablet Take 20 mg by mouth daily.        . Tamsulosin HCl (FLOMAX) 0.4 MG CAPS Take 0.4 mg by mouth.       Assessment: 78 yo male with weakness/fever, poss PNA for empiric antibiotics  Vancomycin 1 g IV given at [facility] at 6 pm  Goal of  Therapy:  Vancomycin trough level 15-20 mcg/ml  Plan:  Vancomycin 1 g IV now, then 1 g IV q12h Zosyn 3.375 g IV q8h  Asiah Befort, Gary Fleet 04/03/2011,11:37 PM

## 2011-04-03 NOTE — ED Notes (Signed)
EKG given to EDP Jacubowitz- no old ekg available

## 2011-04-04 ENCOUNTER — Encounter (HOSPITAL_COMMUNITY): Payer: Self-pay | Admitting: *Deleted

## 2011-04-04 ENCOUNTER — Observation Stay (HOSPITAL_COMMUNITY): Payer: Medicare Other

## 2011-04-04 ENCOUNTER — Other Ambulatory Visit: Payer: Self-pay

## 2011-04-04 DIAGNOSIS — D709 Neutropenia, unspecified: Secondary | ICD-10-CM | POA: Diagnosis present

## 2011-04-04 DIAGNOSIS — R509 Fever, unspecified: Secondary | ICD-10-CM | POA: Diagnosis present

## 2011-04-04 LAB — INFLUENZA PANEL BY PCR (TYPE A & B)
H1N1 flu by pcr: NOT DETECTED
Influenza A By PCR: NEGATIVE
Influenza B By PCR: NEGATIVE

## 2011-04-04 LAB — LEGIONELLA ANTIGEN, URINE

## 2011-04-04 LAB — CBC
HCT: 36.6 % — ABNORMAL LOW (ref 39.0–52.0)
Hemoglobin: 12.5 g/dL — ABNORMAL LOW (ref 13.0–17.0)
RBC: 3.96 MIL/uL — ABNORMAL LOW (ref 4.22–5.81)
WBC: 2.5 10*3/uL — ABNORMAL LOW (ref 4.0–10.5)

## 2011-04-04 LAB — BASIC METABOLIC PANEL
BUN: 15 mg/dL (ref 6–23)
Chloride: 102 mEq/L (ref 96–112)
GFR calc Af Amer: 76 mL/min — ABNORMAL LOW (ref >90)
Potassium: 4.3 mEq/L (ref 3.5–5.1)
Sodium: 137 mEq/L (ref 135–145)

## 2011-04-04 MED ORDER — LEVOFLOXACIN IN D5W 500 MG/100ML IV SOLN
500.0000 mg | INTRAVENOUS | Status: DC
  Administered 2011-04-04: 500 mg via INTRAVENOUS
  Filled 2011-04-04 (×2): qty 100

## 2011-04-04 MED ORDER — GLUCOSAMINE HCL 1500 MG PO TABS: 3000.0000 mg | ORAL_TABLET | Freq: Every day | ORAL | Status: DC

## 2011-04-04 MED ORDER — ROSUVASTATIN CALCIUM 20 MG PO TABS
20.0000 mg | ORAL_TABLET | Freq: Every day | ORAL | Status: DC
  Administered 2011-04-04 – 2011-04-05 (×2): 20 mg via ORAL
  Filled 2011-04-04 (×2): qty 1

## 2011-04-04 MED ORDER — TAMSULOSIN HCL 0.4 MG PO CAPS
0.4000 mg | ORAL_CAPSULE | Freq: Every day | ORAL | Status: DC
  Administered 2011-04-04 – 2011-04-05 (×2): 0.4 mg via ORAL
  Filled 2011-04-04 (×2): qty 1

## 2011-04-04 MED ORDER — EZETIMIBE 10 MG PO TABS
10.0000 mg | ORAL_TABLET | Freq: Every day | ORAL | Status: DC
  Administered 2011-04-04 – 2011-04-05 (×2): 10 mg via ORAL
  Filled 2011-04-04 (×2): qty 1

## 2011-04-04 MED ORDER — NAPROXEN 250 MG PO TABS
250.0000 mg | ORAL_TABLET | Freq: Two times a day (BID) | ORAL | Status: DC
  Administered 2011-04-04 – 2011-04-05 (×3): 250 mg via ORAL
  Filled 2011-04-04 (×5): qty 1

## 2011-04-04 MED ORDER — OMEGA-3-ACID ETHYL ESTERS 1 G PO CAPS
2.0000 g | ORAL_CAPSULE | Freq: Every day | ORAL | Status: DC
  Administered 2011-04-04 – 2011-04-05 (×2): 2 g via ORAL
  Filled 2011-04-04 (×2): qty 2

## 2011-04-04 MED ORDER — VITAMIN D 1000 UNITS PO TABS
2000.0000 [IU] | ORAL_TABLET | Freq: Every day | ORAL | Status: DC
  Administered 2011-04-04 – 2011-04-05 (×2): 2000 [IU] via ORAL
  Filled 2011-04-04 (×2): qty 2

## 2011-04-04 MED ORDER — ADULT MULTIVITAMIN W/MINERALS CH
1.0000 | ORAL_TABLET | Freq: Every day | ORAL | Status: DC
  Administered 2011-04-04 – 2011-04-05 (×2): 1 via ORAL
  Filled 2011-04-04 (×2): qty 1

## 2011-04-04 MED ORDER — ASPIRIN EC 81 MG PO TBEC
81.0000 mg | DELAYED_RELEASE_TABLET | Freq: Every day | ORAL | Status: DC
  Administered 2011-04-04 – 2011-04-05 (×2): 81 mg via ORAL
  Filled 2011-04-04 (×2): qty 1

## 2011-04-04 MED ORDER — NAPROXEN SODIUM 220 MG PO TABS: 220.0000 mg | ORAL_TABLET | Freq: Two times a day (BID) | ORAL | Status: DC

## 2011-04-04 NOTE — Progress Notes (Signed)
ANTIBIOTIC CONSULT NOTE - INITIAL  Pharmacy Consult for Levaquin Indication: pneumonia  Assessment: 78 yo M known to pharmacy from vancomycin and Zosyn dosing. Vancomycin and Zosyn discontinued and changed to Levaquin.  Renal fxn is wnl.  Plan:  1. Levaquin 500 mg IV q24h. Consider stopping after 7 days of therapy. 2. Pharmacy signing off. Please re-consult if needed.   No Known Allergies  Patient Measurements: Height: 6\' 2"  (188 cm) Weight: 287 lb 8 oz (130.409 kg) (wt  in  bed) IBW/kg (Calculated) : 82.2   Vital Signs: Temp: 99.5 F (37.5 C) (01/13 0710) BP: 158/62 mmHg (01/13 0710) Pulse Rate: 68  (01/13 0710) Intake/Output from previous day: 01/12 0701 - 01/13 0700 In: 2887.5 [P.O.:240; I.V.:2347.5; IV Piggyback:300] Out: 100 [Urine:100] Intake/Output from this shift:    Labs:  Basename 04/04/11 0630 04/03/11 1610  WBC 2.5* 2.8*  HGB 12.5* 14.0  PLT 71* 83*  LABCREA -- --  CREATININE 1.06 0.90   Estimated Creatinine Clearance: 83.8 ml/min (by C-G formula based on Cr of 1.06). No results found for this basename: VANCOTROUGH:2,VANCOPEAK:2,VANCORANDOM:2,GENTTROUGH:2,GENTPEAK:2,GENTRANDOM:2,TOBRATROUGH:2,TOBRAPEAK:2,TOBRARND:2,AMIKACINPEAK:2,AMIKACINTROU:2,AMIKACIN:2, in the last 72 hours   Microbiology: No results found for this or any previous visit (from the past 720 hour(s)).  Medical History: Past Medical History  Diagnosis Date  . Cancer   . Prostate cancer   . Lymph node cancer   . Vertigo   . High cholesterol   . Lymphoma in remission     Medications:  Scheduled:    . sodium chloride   Intravenous Once  . sodium chloride   Intravenous STAT  . acetaminophen  1,000 mg Oral Once  . aspirin EC  81 mg Oral Daily  . cholecalciferol  2,000 Units Oral Daily  . ezetimibe  10 mg Oral Daily  . mulitivitamin with minerals  1 tablet Oral Daily  . naproxen  250 mg Oral BID WC  . omega-3 acid ethyl esters  2 g Oral Daily  . piperacillin-tazobactam  (ZOSYN)  IV  3.375 g Intravenous Once  . rosuvastatin  20 mg Oral Daily  . sodium chloride  2,000 mL Intravenous Once  . Tamsulosin HCl  0.4 mg Oral Daily  . vancomycin  1,000 mg Intravenous Once  . DISCONTD: Glucosamine HCl  3,000 mg Oral Daily  . DISCONTD: naproxen sodium  220 mg Oral BID WC  . DISCONTD: piperacillin-tazobactam (ZOSYN)  IV  3.375 g Intravenous Q8H  . DISCONTD: vancomycin  1,000 mg Intravenous Q12H    Lovell Sheehan 04/04/2011,1:04 PM

## 2011-04-04 NOTE — Progress Notes (Signed)
Physical Therapy Evaluation Patient Details Name: Darren Frank MRN: [id number] DOB: 1932/02/26 Today's Date: 04/04/2011  Problem List:  Patient Active Problem List  Diagnoses  . Prostate cancer  . Lymph node cancer  . Fever  . Neutropenia, unspecified    Past Medical History:  Past Medical History  Diagnosis Date  . Cancer   . Prostate cancer   . Lymph node cancer   . Vertigo   . High cholesterol   . Lymphoma in remission    Past Surgical History:  Past Surgical History  Procedure Date  . Cataract extraction     PT Assessment/Plan/Recommendation PT Assessment Clinical Impression Statement: 78 yo male admitted with weakness, fever, pneumonia presents at independent/supervision level for all mobility; OK for dc home from PT standpoint PT Recommendation/Assessment: All further PT needs can be met in the next venue of care PT Recommendation Follow Up Recommendations: Outpatient PT (prn for vertigo; goes to [facility]) Equipment Recommended: None recommended by PT PT Goals     PT Evaluation Precautions/Restrictions  Precautions Precautions: Fall (slight fall risk) Precaution Comments: Pt aware of need to be careful with turns Prior Functioning  Home Living Lives With: Spouse Receives Help From: Family Type of Home: House Home Layout: One level Home Access: Stairs to enter Entrance Stairs-Rails: Right Entrance Stairs-Number of Steps: 3 Prior Function Level of Independence: Independent with homemaking with ambulation Cognition Cognition Arousal/Alertness: Awake/alert Overall Cognitive Status: Appears within functional limits for tasks assessed Orientation Level: Oriented X4 Sensation/Coordination Coordination Gross Motor Movements are Fluid and Coordinated: Yes Fine Motor Movements are Fluid and Coordinated: Not tested Extremity Assessment RUE Assessment RUE Assessment: Within Functional Limits LUE Assessment LUE Assessment: Within Functional  Limits RLE Assessment RLE Assessment: Within Functional Limits LLE Assessment LLE Assessment: Within Functional Limits Mobility (including Balance) Bed Mobility Bed Mobility: Yes Supine to Sit: 6: Modified independent (Device/Increase time);HOB flat Transfers Transfers: Yes Sit to Stand: 6: Modified independent (Device/Increase time) Stand to Sit: 5: Supervision Stand to Sit Details: cues to control descent Ambulation/Gait Ambulation/Gait: Yes Ambulation/Gait Assistance: 5: Supervision Ambulation/Gait Assistance Details (indicate cue type and reason): cues to self-monitor for balance; cues to activate gluteals for fully upright posture; cue for increase toe-off push-off to make swing phase easier; one instance of loss of balance while pt was turning and doffing his mask Ambulation Distance (Feet): 140 Feet Assistive device: None (and pushing IV pole) Stairs:  (pt and family are confident in ability to go up steps at hom)       End of Session PT - End of Session Activity Tolerance: Patient tolerated treatment well Patient left: in bed;with call bell in reach;with family/visitor present (sitting EOB) General Behavior During Session: [facility] for tasks performed Cognition: [facility] for tasks performed  Van Clines [facility] [place], [place] [phone]  04/04/2011, 5:18 PM

## 2011-04-04 NOTE — Progress Notes (Signed)
Subjective: Patient states that he feels better today.  States that the weakness has subsided.  Denies any fevers, difficulty breathing, cough, diarrhea since his admission.  No acute issues overnight.  Pt states that he was diagnosed with prostate cancer and recently completed radiation therapy for his prostate cancer.  States that he completed a total of 38 times of radiation therapy.   Objective: Filed Vitals:   04/03/11 2051 04/04/11 0023 04/04/11 0331 04/04/11 0710  BP:  162/58 148/55 158/62  Pulse:  61 71 68  Temp:  99.5 F (37.5 C) 99.8 F (37.7 C) 99.5 F (37.5 C)  TempSrc:  Oral    Resp:  18 18 18   Height:      Weight: 130.409 kg (287 lb 8 oz)     SpO2:  95% 95% 100%   Weight change:   Intake/Output Summary (Last 24 hours) at 04/04/11 1259 Last data filed at 04/04/11 0528  Gross per 24 hour  Intake 2887.5 ml  Output    100 ml  Net 2787.5 ml    General: Alert, awake, oriented x3, in no acute distress.  HEENT: No bruits, no goiter.  Heart: Regular rate and rhythm, without murmurs, rubs, gallops.  Lungs: CTA BL Abdomen: Soft, nontender, nondistended, positive bowel sounds.  Neuro: Grossly intact, nonfocal. Skin: no rashes, or edema   Lab Results:  Basename 04/04/11 0630 04/03/11 1610  NA 137 134*  K 4.3 4.1  CL 102 97  CO2 28 26  GLUCOSE 108* 103*  BUN 15 14  CREATININE 1.06 0.90  CALCIUM 8.2* 9.0  MG -- --  PHOS -- --    Basename 04/03/11 1610  AST 39*  ALT 12  ALKPHOS 38*  BILITOT 0.7  PROT 7.2  ALBUMIN 4.0   No results found for this basename: LIPASE:2,AMYLASE:2 in the last 72 hours  Basename 04/04/11 0630 04/03/11 1610  WBC 2.5* 2.8*  NEUTROABS -- 2.4  HGB 12.5* 14.0  HCT 36.6* 40.9  MCV 92.4 90.3  PLT 71* 83*    Basename 04/03/11 1610  CKTOTAL 172  CKMB --  CKMBINDEX --  TROPONINI --   No components found with this basename: POCBNP:3 No results found for this basename: DDIMER:2 in the last 72 hours No results found for this  basename: HGBA1C:2 in the last 72 hours No results found for this basename: CHOL:2,HDL:2,LDLCALC:2,TRIG:2,CHOLHDL:2,LDLDIRECT:2 in the last 72 hours No results found for this basename: TSH,T4TOTAL,FREET3,T3FREE,THYROIDAB in the last 72 hours No results found for this basename: VITAMINB12:2,FOLATE:2,FERRITIN:2,TIBC:2,IRON:2,RETICCTPCT:2 in the last 72 hours  Micro Results: No results found for this or any previous visit (from the past 240 hour(s)).  Studies/Results: Dg Chest 2 View  04/04/2011  *RADIOLOGY REPORT*  Clinical Data: Fever, weakness.  CHEST - 2 VIEW  Comparison: 04/03/2011  Findings: The film is made with shallow lung inflation.  Heart size is enlarged.  There are no focal consolidations or pleural effusions.  There is minimal bibasilar atelectasis, left greater than right.  No pulmonary edema.  Old right rib fractures.  IMPRESSION:  1.  Shallow inflation. 2.  Minimal bibasilar atelectasis.  Original Report Authenticated By: Patterson Hammersmith, M.D.   Dg Chest Port 1 View  04/03/2011  *RADIOLOGY REPORT*  Clinical Data: Weakness.  Cough.  Fever.  PORTABLE CHEST - 1 VIEW  Comparison: 08/18/2009  Findings: Remote right rib trauma.  Mild cardiomegaly.  Left hemidiaphragm elevation persists.  No right and no definite left pleural effusion. No pneumothorax.  Right lung is clear.  Vague opacity at the left lung base.  IMPRESSION:  1.  Cardiomegaly with low lung volumes and left hemidiaphragm elevation. 2.  Vague density along the periphery of the left hemidiaphragm. Minimal airspace disease and/or pleural fluid cannot be entirely excluded.  Especially if there is a concern of left lower lobe pneumonia, consider PA and lateral radiographs.  Original Report Authenticated By: Consuello Bossier, M.D.    Medications: I have reviewed the patient's current medications.  Fever:  Has resolved after IV abx regimen.  Will switch from vanc and zosyn to levaquin given patient's current thrombocytopenia.  Will  also plan on ordering flu panel, urine legionella antigen, and urine strep pneumonia antigen.  Pt's chest x ray was negative and likely hood of bacterial pneumonia low.  Will follow up with results.  Leukopenia/Thrombocytopenia: Most likely related to recent radiation therapy.  Will continue to monitor and may have patient f/u with his hematologist/oncologist as outpatient.  Prostates Cancer:  Pt completed radiation therapy at [facility].  Likely contributing to leukopenia/thrombocytopenia.    Disposition:  Will order physical therapy to evaluate and treat.  Also will plan on observing the patient one more day.  Should fevers subside and patient's leg stregth at baseline would consider discharging with close follow-up with his PCP and hematologist/oncologist.    LOS: 1 day   Penny Pia M.D.  [facility] 04/04/2011, 12:59 PM

## 2011-04-04 NOTE — Progress Notes (Signed)
PHARMACIST - PHYSICIAN COMMUNICATION DR: TRH CONCERNING: P&T Medication Policy on Herbal Medications  DESCRIPTION:  This patient's order for:  Glucosamine   has been noted. This product(s) is classified as an "herbal" or natural product. Due to a lack of definitive safety studies or FDA approval, nonstandard manufacturing practices, plus the potential risk of unknown drug-drug interactions while on inpatient medications, the Pharmacy and Therapeutics Committee does not permit the use of "herbal" or natural products of this type within [facility].   RECOMMENDATION: The pharmacy department has rejected this order at this time and your patient has been informed of this safety policy. Please reevaluate patient's clinical condition at discharge and address if the herbal or natural product(s) should be resumed at that time.

## 2011-04-05 LAB — STREP PNEUMONIAE URINARY ANTIGEN: Strep Pneumo Urinary Antigen: NEGATIVE

## 2011-04-05 MED ORDER — LEVOFLOXACIN 500 MG PO TABS: 500.0000 mg | ORAL_TABLET | Freq: Every day | ORAL | Status: AC

## 2011-04-05 NOTE — Progress Notes (Signed)
04/05/2011 Darren Frank Case Management Note [phone]  Utilization review completed.  

## 2011-04-05 NOTE — Discharge Summary (Signed)
Admit date: 04/03/2011 Discharge date: 04/05/2011  Primary Care Physician:  Londell Moh, MD, MD   Discharge Diagnoses:   No resolved problems to display.  Active Hospital Problems  Diagnoses Date Noted   . Fever 04/04/2011   . Neutropenia, unspecified 04/04/2011     Resolved Hospital Problems  Diagnoses Date Noted Date Resolved     DISCHARGE MEDICATION: Current Discharge Medication List    START taking these medications   Details  levofloxacin (LEVAQUIN) 500 MG tablet Take 1 tablet (500 mg total) by mouth daily. Qty: 5 tablet, Refills: 0      CONTINUE these medications which have NOT CHANGED   Details  aspirin 81 MG tablet Take 81 mg by mouth daily.      Cholecalciferol (VITAMIN D) 2000 UNITS tablet Take 2,000 Units by mouth daily.    ezetimibe (ZETIA) 10 MG tablet Take 10 mg by mouth daily.      Multiple Vitamin (MULITIVITAMIN WITH MINERALS) TABS Take 1 tablet by mouth daily.    naproxen sodium (ANAPROX) 220 MG tablet Take 220 mg by mouth 2 (two) times daily with a meal. For back pain    Omega-3 Fatty Acids (FISH OIL TRIPLE STRENGTH) 1400 MG CAPS Take 2 capsules by mouth daily.     rosuvastatin (CRESTOR) 20 MG tablet Take 20 mg by mouth daily.      Tamsulosin HCl (FLOMAX) 0.4 MG CAPS Take 0.4 mg by mouth.      STOP taking these medications     Glucosamine HCl (GLUCOSAMINE MAXIMUM STRENGTH) 1500 MG TABS            Consults:     SIGNIFICANT DIAGNOSTIC STUDIES:  Dg Chest 2 View  04/04/2011  *RADIOLOGY REPORT*  Clinical Data: Fever, weakness.  CHEST - 2 VIEW  Comparison: 04/03/2011  Findings: The film is made with shallow lung inflation.  Heart size is enlarged.  There are no focal consolidations or pleural effusions.  There is minimal bibasilar atelectasis, left greater than right.  No pulmonary edema.  Old right rib fractures.  IMPRESSION:  1.  Shallow inflation. 2.  Minimal bibasilar atelectasis.  Original Report Authenticated By: Patterson Hammersmith, M.D.   Dg Chest Port 1 View  04/03/2011  *RADIOLOGY REPORT*  Clinical Data: Weakness.  Cough.  Fever.  PORTABLE CHEST - 1 VIEW  Comparison: 08/18/2009  Findings: Remote right rib trauma.  Mild cardiomegaly.  Left hemidiaphragm elevation persists.  No right and no definite left pleural effusion. No pneumothorax.  Right lung is clear.  Vague opacity at the left lung base.  IMPRESSION:  1.  Cardiomegaly with low lung volumes and left hemidiaphragm elevation. 2.  Vague density along the periphery of the left hemidiaphragm. Minimal airspace disease and/or pleural fluid cannot be entirely excluded.  Especially if there is a concern of left lower lobe pneumonia, consider PA and lateral radiographs.  Original Report Authenticated By: Consuello Bossier, M.D.     ECHO:N/A     CARDIAC CATH & OTHER PROCEDURES:N/A  No results found for this or any previous visit (from the past 240 hour(s)).  BRIEF ADMITTING H & P:  Pt is a 78 y/o Male with h/o prostate cancer that reportedly just finished a course of 38 treatments total of radiation therapy for his prostace cancer.  Presented to the hospital after a fever and reported weakness at home.  Patient did not have any complaints other than the fever and the weakness.  The first day I saw him after  his admission he stated that his weakness has resolved.  He was placed initially on IV vanc and Zosyn which I discontinuid after his second day and replaced with levaquin given his thrombocytopenia. Initial work-up which included a chest x ray was negative but patient's WBC count was low at 2.8.  I believe that the Leukopenia and thrombocytopenia are related to his recent radiation therapies of which he received several as listed above.  The following day he had pancytopenia but I suspect that this may have been a dilutional cbc. We also tested for influeza which came negative. At this juncture the only positive findings were a urinalysis which showed cloudy urine and  bacteria, but was U/A negative for Leukocytes or nitrites.  So I will plan on discharging patient home on antibiotics (levaquin) to complete a 7 day total regimen.    Pt will need levaquin for 5 more days post d/c.  Given risk of catching a nosocomial infection I feel that patient is ok for discharge with follow up as indicated below.  Patient vocalizes agreement with plan.  No resolved problems to display.  Active Hospital Problems  Diagnoses Date Noted   . Fever 04/04/2011   . Neutropenia, unspecified 04/04/2011     Resolved Hospital Problems  Diagnoses Date Noted Date Resolved     Disposition and Follow-up: As indicated below Discharge Orders    Future Orders Please Complete By Expires   Diet - low sodium heart healthy      Increase activity slowly      Discharge instructions      Comments:   Patient is to follow up with his Oncologist in ~ 1-2 weeks post discharge.   Follow up with Primary care physician 2-5 days post discharge.   Call MD for:  persistant nausea and vomiting      Call MD for:  temperature >100.4      Call MD for:  redness, tenderness, or signs of infection (pain, swelling, redness, odor or green/yellow discharge around incision site)        Follow-up Information    Follow up with Londell Moh, MD .          DISCHARGE EXAM:   General: Alert, awake, oriented x3, in no acute distress. HEENT: No bruits, no goiter. Heart: Regular rate and rhythm, without murmurs, rubs, gallops. Lungs: Clear to auscultation bilaterally. Abdomen: Soft, nontender, nondistended, positive bowel sounds. Extremities: No clubbing cyanosis or edema with positive pedal pulses. Neuro: Grossly intact, nonfocal.    Blood pressure 138/62, pulse 72, temperature 98.6 F (37 C), temperature source Oral, resp. rate 19, height 6\' 2"  (1.88 m), weight 130.409 kg (287 lb 8 oz), SpO2 96.00%.   Basename 04/04/11 0630 04/03/11 1610  NA 137 134*  K 4.3 4.1  CL 102 97  CO2 28 26   GLUCOSE 108* 103*  BUN 15 14  CREATININE 1.06 0.90  CALCIUM 8.2* 9.0  MG -- --  PHOS -- --    Basename 04/03/11 1610  AST 39*  ALT 12  ALKPHOS 38*  BILITOT 0.7  PROT 7.2  ALBUMIN 4.0   No results found for this basename: LIPASE:2,AMYLASE:2 in the last 72 hours  Basename 04/04/11 0630 04/03/11 1610  WBC 2.5* 2.8*  NEUTROABS -- 2.4  HGB 12.5* 14.0  HCT 36.6* 40.9  MCV 92.4 90.3  PLT 71* 83*    Signed: Penny Pia M.D. 04/05/2011, 8:42 AM

## 2011-04-06 NOTE — Progress Notes (Signed)
Discharge instructions reviewed with patient and wife. Questions answered re: prescription and plan for home medications. AVS and prescription given to patient. Pt discharged to home with wife.

## 2013-07-05 IMAGING — CR DG CHEST 2V
3 series · 3 of 3 positions shown · non-contrast
Comparison: 04/03/2011

CLINICAL DATA: Fever, weakness.

CHEST - 2 VIEW

[x chest ap]
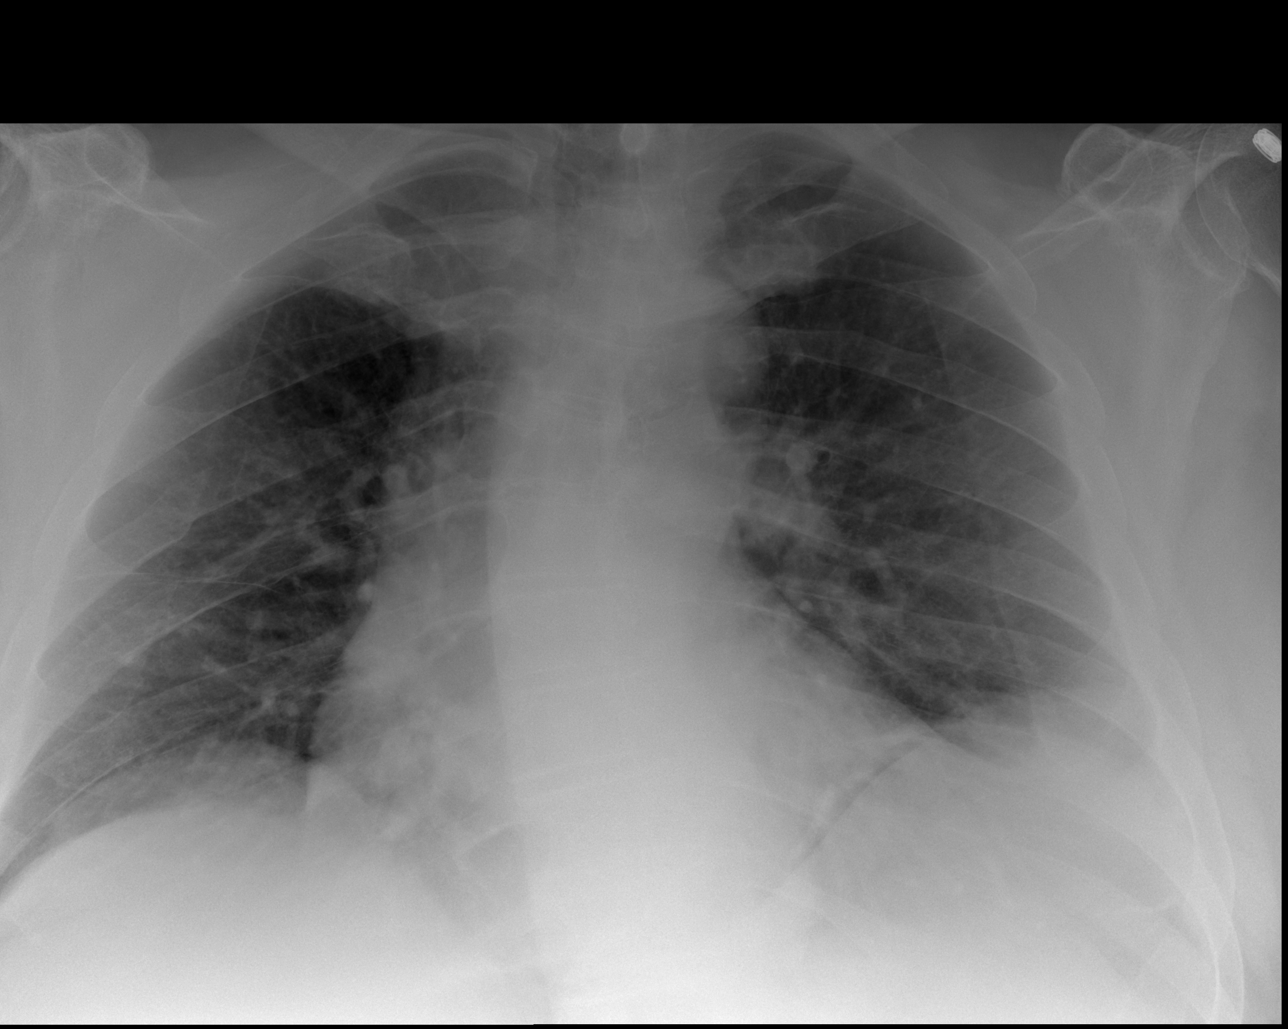

[w chest lat (1 of 2)]
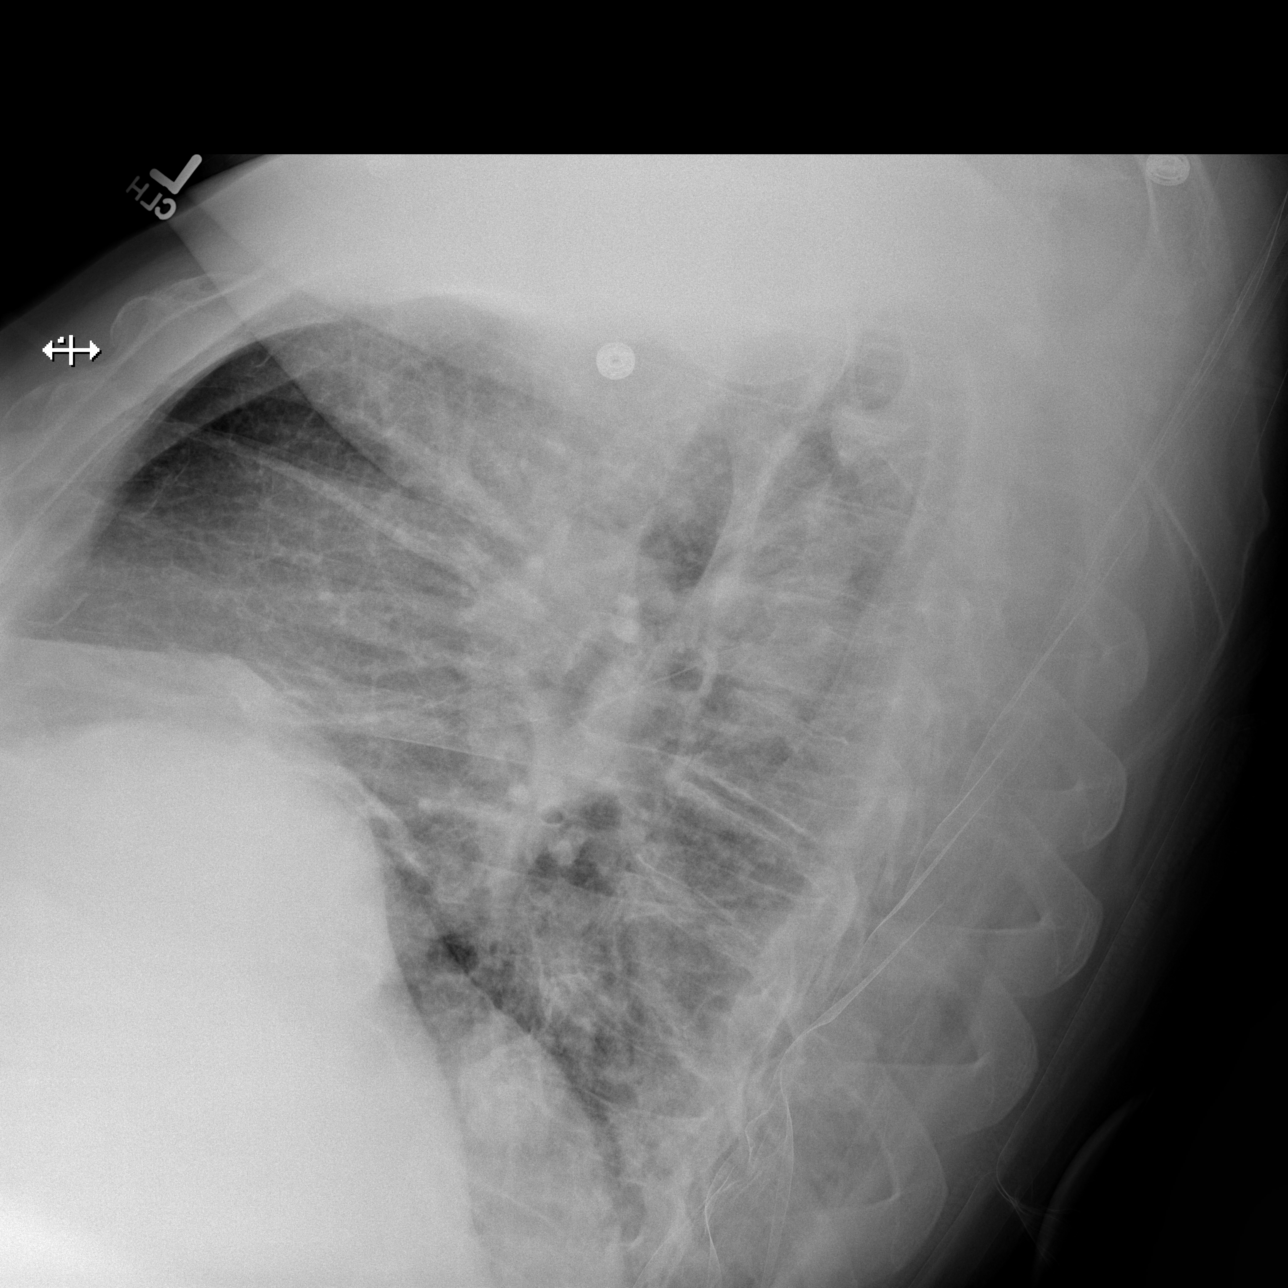

[w chest lat (2 of 2)]
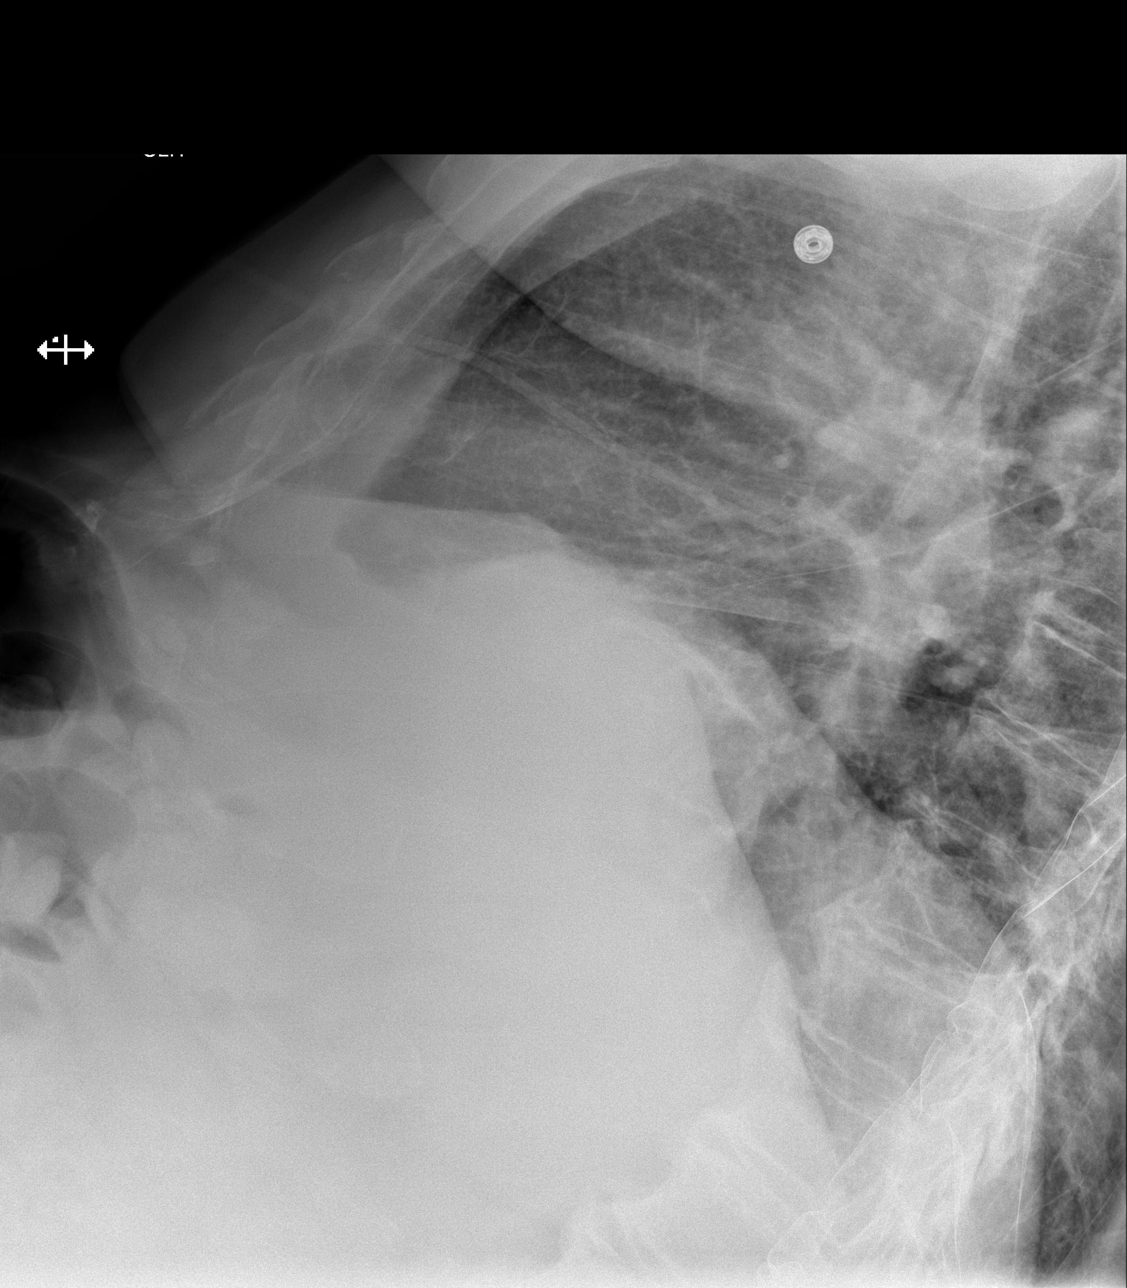

[3 of 3 positions shown; findings below may reference images not displayed]

FINDINGS: The film is made with shallow lung inflation.  Heart size
is enlarged.  There are no focal consolidations or pleural
effusions.  There is minimal bibasilar atelectasis, left greater
than right.  No pulmonary edema.  Old right rib fractures.
IMPRESSION: 1.  Shallow inflation.
2.  Minimal bibasilar atelectasis.

## 2014-04-09 ENCOUNTER — Ambulatory Visit: Payer: Self-pay | Admitting: Internal Medicine

## 2014-04-09 ENCOUNTER — Encounter: Payer: Self-pay | Admitting: Internal Medicine

## 2014-04-09 NOTE — Progress Notes (Signed)
Patient ID: Darren Frank, male   DOB: 01/27/1932, 81 y.o.   MRN: [id number] The patient's chart has been reviewed by Dr. Carlean Purl  and the recommendations are noted below.  .  Follow-up advised. Schedule patient for next available appointment. Outcome of communication with the patient: letter mailed

## 2018-06-20 ENCOUNTER — Institutional Professional Consult (permissible substitution): Payer: Medicare Other | Admitting: Neurology

## 2018-07-05 ENCOUNTER — Ambulatory Visit: Payer: Self-pay | Admitting: Diagnostic Neuroimaging

## 2023-01-26 ENCOUNTER — Ambulatory Visit: Payer: Medicare Other | Admitting: Physical Therapy

## 2023-01-27 ENCOUNTER — Other Ambulatory Visit: Payer: Self-pay

## 2023-01-27 ENCOUNTER — Ambulatory Visit: Payer: Medicare Other | Attending: Internal Medicine | Admitting: Physical Therapy

## 2023-01-27 DIAGNOSIS — R2681 Unsteadiness on feet: Secondary | ICD-10-CM | POA: Diagnosis present

## 2023-01-27 DIAGNOSIS — R2689 Other abnormalities of gait and mobility: Secondary | ICD-10-CM | POA: Diagnosis present

## 2023-01-27 DIAGNOSIS — R609 Edema, unspecified: Secondary | ICD-10-CM | POA: Insufficient documentation

## 2023-01-27 DIAGNOSIS — R42 Dizziness and giddiness: Secondary | ICD-10-CM | POA: Insufficient documentation

## 2023-01-27 DIAGNOSIS — R293 Abnormal posture: Secondary | ICD-10-CM | POA: Diagnosis present

## 2023-01-27 DIAGNOSIS — M6281 Muscle weakness (generalized): Secondary | ICD-10-CM | POA: Diagnosis present

## 2023-01-27 NOTE — Therapy (Signed)
OUTPATIENT PHYSICAL THERAPY LOWER EXTREMITY EVALUATION   Patient Name: Darren Frank MRN: [id number] DOB:March 29, 1931, 89 y.o., male Today's Date: 01/27/2023  END OF SESSION:  PT End of Session - 01/27/23 1431     Visit Number 1    Number of Visits 16    Date for PT Re-Evaluation 03/24/23    Authorization Type [employer]    Progress Note Due on Visit 10    PT Start Time 1431    PT Stop Time 1515    PT Time Calculation (min) 44 min    Activity Tolerance Patient tolerated treatment well    Behavior During Therapy WFL for tasks assessed/performed             Past Medical History:  Diagnosis Date   Anal polyp    Bradycardia found on measurement of baseline fetal heart rate    Diplopia    Diverticulosis    Hearing loss    High cholesterol    History of radiation therapy    Prostate, done at    Lymph node cancer (HCC)    Lymphoma in remission (HCC)    OA (osteoarthritis)    knees   Obese    Peripheral neuropathy    Prostate cancer (HCC)    Tinnitus    Vertigo    Past Surgical History:  Procedure Laterality Date   BACK SURGERY     diskectomy   CATARACT EXTRACTION Right    COLONOSCOPY  04/06/2000   no more due to radiation treatment to prostate/lower bowel   lymphatic surgery, left groin     TONSILLECTOMY     Patient Active Problem List   Diagnosis Date Noted   Fever 04/04/2011   Neutropenia, unspecified (HCC) 04/04/2011   Prostate cancer (HCC)    Lymph node cancer (HCC)     PCP: Merri Brunette, MD  REFERRING PROVIDER: Merri Brunette, MD  REFERRING DIAG: R26.9 (ICD-10-CM) - Unspecified abnormalities of gait and mobility  THERAPY DIAG:  Muscle weakness (generalized)  Other abnormalities of gait and mobility  Abnormal posture  Edema, unspecified type  Unsteadiness on feet  Dizziness and giddiness  Rationale for Evaluation and Treatment: Rehabilitation  ONSET DATE: Chronic >10 years  SUBJECTIVE:   SUBJECTIVE STATEMENT: Pt states in the  87s he had a partial disc taken out of his lower back. Pt states veins in his legs are okay but he has limited feeling in both feet. Pt states he's been using the seated elliptical (trying to get 4000 steps a day). Pt states he has bilat LE edema for the last couple of months. Pt reports he uses a cane to steady him due to vertigo. Pt states his R LE catches (states this is chronic since the disc was taken out). Pt states he has had vertigo for >10 years. Pt reports difficulty with getting out of the chair. States he walks looking down. Notes dizziness with looking up. Sleeps in recliner due to difficulty with bed mobility. He reports he has had exercises for dizziness years ago but stopped doing them because it made him dizzy.   PERTINENT HISTORY: Cancer, partial lumbar discectomy PAIN:  Are you having pain? No  PRECAUTIONS: None  RED FLAGS: None   WEIGHT BEARING RESTRICTIONS: No  FALLS:  Has patient fallen in last 6 months? No  LIVING ENVIRONMENT: Lives with: lives with their spouse and lives with their daughter Lives in: House/apartment Stairs: Yes: Internal: 12-13 steps; on right going up Has following equipment at home:  Single point cane  OCCUPATION: Retired - likes to watch football, play cards  PLOF: Independent  PATIENT GOALS: Get out of a chair  NEXT MD VISIT: n/a  OBJECTIVE:  Note: Objective measures were completed at Evaluation unless otherwise noted.  DIAGNOSTIC FINDINGS: Nothing recent on Epic  PATIENT SURVEYS:  Lower Extremity Functional Score: 31 / 80 = 38.8 %  COGNITION: Overall cognitive status: Within functional limits for tasks assessed     SENSATION: N/T  EDEMA:  Bilat LE edema evident (states he bought compression stockings)  MUSCLE LENGTH: Did not assess  POSTURE: rounded shoulders, forward head, and flexed trunk   PALPATION: Did not assess  LOWER EXTREMITY ROM:  Active ROM Right eval Left eval  Hip flexion    Hip extension    Hip  abduction    Hip adduction    Hip internal rotation    Hip external rotation    Knee flexion    Knee extension    Ankle dorsiflexion    Ankle plantarflexion    Ankle inversion    Ankle eversion     (Blank rows = not tested)  LOWER EXTREMITY MMT:  MMT Right eval Left eval  Hip flexion 3+ 3+  Hip extension 3 3  Hip abduction 3 3  Hip adduction    Hip internal rotation    Hip external rotation    Knee flexion 4 5  Knee extension 5 5  Ankle dorsiflexion 4 4+  Ankle plantarflexion    Ankle inversion    Ankle eversion     (Blank rows = not tested)  LOWER EXTREMITY SPECIAL TESTS:  Unable to assess  TRANSFERS:  Sit to stand: SBA Has to use UEs, braces LEs against table  Stand to sit: SBA has to use UEs  Sit to sidelying: Min A for LE negotiation; left sidelying limited due to pt dizziness  Sidelying to sit: Min A for trunk negotiation  Sit to supine: Pt unable to perform (pt states he cannot tolerate supine)  Rolling L<>R: Pt states he is unable to perform    FUNCTIONAL TESTS:  5 times sit to stand: 29.31 sec with UE support and bracing LEs against mat table TUG: 17.94 sec   GAIT: Distance walked: Into clinic Assistive device utilized: Single point cane Level of assistance: CGA Comments: diminished heel strike bilaterally (R worse than L), decreased step length bilaterally, forward flexed trunk   TODAY'S TREATMENT:                                                                                                                              DATE: 01/27/23  Did not initiate  PATIENT EDUCATION:  Education details: Exam findings, POC Person educated: Patient Education method: Explanation and Demonstration Education comprehension: verbalized understanding and needs further education  HOME EXERCISE PROGRAM: To be provided  ASSESSMENT:  CLINICAL IMPRESSION: Patient is a 89 y.o. M who was seen today for physical therapy evaluation and  treatment for bilat LE  weakness. PMH significant for dizziness, cancer, and prior discectomy. Assessment significant for bilat LE weakness, decreased balance, postural deficits, edema and decreased endurance affecting functional mobility, t/fs and amb. Pt limited in his tolerance to positions such as supine and has chronic dizziness that by report sounds suspicious for BPPV (PT will monitor and see how much this affects pt's balance and progress). Pt will benefit from PT to maximize his level of mobility.   OBJECTIVE IMPAIRMENTS: Abnormal gait, decreased activity tolerance, decreased balance, decreased endurance, decreased mobility, difficulty walking, decreased ROM, decreased strength, dizziness, increased edema, impaired sensation, improper body mechanics, and postural dysfunction.   ACTIVITY LIMITATIONS: lifting, bending, standing, squatting, stairs, transfers, bed mobility, toileting, and locomotion level  PARTICIPATION LIMITATIONS: meal prep, cleaning, shopping, and community activity  PERSONAL FACTORS: Age, Fitness, Past/current experiences, and Time since onset of injury/illness/exacerbation are also affecting patient's functional outcome.   REHAB POTENTIAL: Good  CLINICAL DECISION MAKING: Evolving/moderate complexity  EVALUATION COMPLEXITY: Moderate   GOALS: Goals reviewed with patient? Yes  SHORT TERM GOALS: Target date: 02/25/2023  Pt will be ind with initial HEP Baseline: Goal status: INITIAL  2.  Pt will be able to perform 5x STS in </=25 sec for improving transfers and functional LE strength Baseline:  Goal status: INITIAL   LONG TERM GOALS: Target date: 03/25/2023   Pt will be ind with progression and management of HEP Baseline:  Goal status: INITIAL  2.  Pt will improve TUG to </=13 sec for reduced fall risk Baseline:  Goal status: INITIAL  3.  Pt will have improved 5x STS to </=20 sec to demo increasing functional LE strength Baseline:  Goal status: INITIAL  4.  Pt will have  improved LEFS score to >/=48.8% to demo MCID Baseline: 38.8% Goal status: INITIAL  5.  Pt will report >/=50% improvement in his sit to stand transfers per his goal Baseline:  Goal status: INITIAL  PLAN:  PT FREQUENCY: 2x/week  PT DURATION: 8 weeks  PLANNED INTERVENTIONS: [id number]- PT Re-evaluation, 97110-Therapeutic exercises, 97530- Therapeutic activity, [id number]- Neuromuscular re-education, 97535- Self Care, [id number]- Manual therapy, [id number]- Gait training, [phone]- Canalith repositioning, [phone]- Aquatic Therapy, Patient/Family education, Stair training, Taping, Joint mobilization, Vestibular training, Cryotherapy, and Moist heat  PLAN FOR NEXT SESSION: Initiate general strengthening HEP. Work on balance/ankle and hip stability.    Runette Scifres April Ma L Trevonne Nyland, PT 01/27/2023, 4:50 PM

## 2023-02-01 ENCOUNTER — Ambulatory Visit: Payer: Medicare Other | Admitting: Physical Therapy

## 2023-02-01 ENCOUNTER — Encounter: Payer: Self-pay | Admitting: Physical Therapy

## 2023-02-01 DIAGNOSIS — M6281 Muscle weakness (generalized): Secondary | ICD-10-CM

## 2023-02-01 DIAGNOSIS — R2681 Unsteadiness on feet: Secondary | ICD-10-CM

## 2023-02-01 DIAGNOSIS — R293 Abnormal posture: Secondary | ICD-10-CM

## 2023-02-01 DIAGNOSIS — R2689 Other abnormalities of gait and mobility: Secondary | ICD-10-CM

## 2023-02-01 DIAGNOSIS — R609 Edema, unspecified: Secondary | ICD-10-CM

## 2023-02-01 NOTE — Therapy (Signed)
OUTPATIENT PHYSICAL THERAPY LOWER EXTREMITY TREATMENT   Patient Name: Darren Frank MRN: [id number] DOB:Mar 10, 1932, 89 y.o., male Today's Date: 02/01/2023  END OF SESSION:  PT End of Session - 02/01/23 1519     Visit Number 2    Date for PT Re-Evaluation 03/24/23    PT Start Time 1519    PT Stop Time 1600    PT Time Calculation (min) 41 min    Equipment Utilized During Treatment Cervical collar    Activity Tolerance Patient tolerated treatment well    Behavior During Therapy WFL for tasks assessed/performed             Past Medical History:  Diagnosis Date   Anal polyp    Bradycardia found on measurement of baseline fetal heart rate    Diplopia    Diverticulosis    Hearing loss    High cholesterol    History of radiation therapy    Prostate, done at    Lymph node cancer (HCC)    Lymphoma in remission    OA (osteoarthritis)    knees   Obese    Peripheral neuropathy    Prostate cancer (HCC)    Tinnitus    Vertigo    Past Surgical History:  Procedure Laterality Date   BACK SURGERY     diskectomy   CATARACT EXTRACTION Right    COLONOSCOPY  04/06/2000   no more due to radiation treatment to prostate/lower bowel   lymphatic surgery, left groin     TONSILLECTOMY     Patient Active Problem List   Diagnosis Date Noted   Fever 04/04/2011   Neutropenia, unspecified (HCC) 04/04/2011   Prostate cancer (HCC)    Lymph node cancer (HCC)     PCP: Merri Brunette, MD  REFERRING PROVIDER: Merri Brunette, MD  REFERRING DIAG: R26.9 (ICD-10-CM) - Unspecified abnormalities of gait and mobility  THERAPY DIAG:  Muscle weakness (generalized)  Other abnormalities of gait and mobility  Abnormal posture  Edema, unspecified type  Unsteadiness on feet  Rationale for Evaluation and Treatment: Rehabilitation  ONSET DATE: Chronic >10 years  SUBJECTIVE:   SUBJECTIVE STATEMENT: Feeling fine, biggest issues is he can't get up from standing.    PERTINENT  HISTORY: Cancer, partial lumbar discectomy PAIN:  Are you having pain? No  PRECAUTIONS: None  RED FLAGS: None   WEIGHT BEARING RESTRICTIONS: No  FALLS:  Has patient fallen in last 6 months? No  LIVING ENVIRONMENT: Lives with: lives with their spouse and lives with their daughter Lives in: House/apartment Stairs: Yes: Internal: 12-13 steps; on right going up Has following equipment at home: Single point cane  OCCUPATION: Retired - likes to watch football, play cards  PLOF: Independent  PATIENT GOALS: Get out of a chair  NEXT MD VISIT: n/a  OBJECTIVE:  Note: Objective measures were completed at Evaluation unless otherwise noted.  DIAGNOSTIC FINDINGS: Nothing recent on Epic  PATIENT SURVEYS:  Lower Extremity Functional Score: 31 / 80 = 38.8 %  COGNITION: Overall cognitive status: Within functional limits for tasks assessed     SENSATION: N/T  EDEMA:  Bilat LE edema evident (states he bought compression stockings)  MUSCLE LENGTH: Did not assess  POSTURE: rounded shoulders, forward head, and flexed trunk   PALPATION: Did not assess  LOWER EXTREMITY ROM:  Active ROM Right eval Left eval  Hip flexion    Hip extension    Hip abduction    Hip adduction    Hip internal rotation  Hip external rotation    Knee flexion    Knee extension    Ankle dorsiflexion    Ankle plantarflexion    Ankle inversion    Ankle eversion     (Blank rows = not tested)  LOWER EXTREMITY MMT:  MMT Right eval Left eval  Hip flexion 3+ 3+  Hip extension 3 3  Hip abduction 3 3  Hip adduction    Hip internal rotation    Hip external rotation    Knee flexion 4 5  Knee extension 5 5  Ankle dorsiflexion 4 4+  Ankle plantarflexion    Ankle inversion    Ankle eversion     (Blank rows = not tested)  LOWER EXTREMITY SPECIAL TESTS:  Unable to assess  TRANSFERS:  Sit to stand: SBA Has to use UEs, braces LEs against table  Stand to sit: SBA has to use UEs  Sit to  sidelying: Min A for LE negotiation; left sidelying limited due to pt dizziness  Sidelying to sit: Min A for trunk negotiation  Sit to supine: Pt unable to perform (pt states he cannot tolerate supine)  Rolling L<>R: Pt states he is unable to perform    FUNCTIONAL TESTS:  5 times sit to stand: 29.31 sec with UE support and bracing LEs against mat table TUG: 17.94 sec   GAIT: Distance walked: Into clinic Assistive device utilized: Single point cane Level of assistance: CGA Comments: diminished heel strike bilaterally (R worse than L), decreased step length bilaterally, forward flexed trunk   TODAY'S TREATMENT:                                                                                                                              DATE:  02/01/23 NuStep L 4 x 6 min S2S 2x10 UE on knees elevated mat  HS curls blue 2x10 LAQ 3lb 2x15 Ankle PF/DF blue x15, x10 4in step ups x10, 6 in step ups x10 with both rails Hip add ball squeeze  01/27/23  Did not initiate  PATIENT EDUCATION:  Education details: Exam findings, POC Person educated: Patient Education method: Explanation and Demonstration Education comprehension: verbalized understanding and needs further education  HOME EXERCISE PROGRAM: To be provided  ASSESSMENT:  CLINICAL IMPRESSION: Patient is a 89 y.o. M who was seen today for physical therapy treatment for bilat LE weakness. PMH significant for dizziness, cancer, and prior discectomy. Pt enters doing well. Pt tolerated an initial progression to TE well evident by no subjective reports f increase pain. Cue needed to come to full standing with sit to stands. Cue needed for pacing not to complete reps to quick and to focus on muscle contraction.  Pt will benefit from PT to maximize his level of mobility.   OBJECTIVE IMPAIRMENTS: Abnormal gait, decreased activity tolerance, decreased balance, decreased endurance, decreased mobility, difficulty walking, decreased ROM,  decreased strength, dizziness, increased edema, impaired sensation, improper body mechanics, and postural dysfunction.   ACTIVITY  LIMITATIONS: lifting, bending, standing, squatting, stairs, transfers, bed mobility, toileting, and locomotion level  PARTICIPATION LIMITATIONS: meal prep, cleaning, shopping, and community activity  PERSONAL FACTORS: Age, Fitness, Past/current experiences, and Time since onset of injury/illness/exacerbation are also affecting patient's functional outcome.   REHAB POTENTIAL: Good  CLINICAL DECISION MAKING: Evolving/moderate complexity  EVALUATION COMPLEXITY: Moderate   GOALS: Goals reviewed with patient? Yes  SHORT TERM GOALS: Target date: 02/25/2023  Pt will be ind with initial HEP Baseline: Goal status: INITIAL  2.  Pt will be able to perform 5x STS in </=25 sec for improving transfers and functional LE strength Baseline:  Goal status: INITIAL   LONG TERM GOALS: Target date: 03/25/2023   Pt will be ind with progression and management of HEP Baseline:  Goal status: INITIAL  2.  Pt will improve TUG to </=13 sec for reduced fall risk Baseline:  Goal status: INITIAL  3.  Pt will have improved 5x STS to </=20 sec to demo increasing functional LE strength Baseline:  Goal status: INITIAL  4.  Pt will have improved LEFS score to >/=48.8% to demo MCID Baseline: 38.8% Goal status: INITIAL  5.  Pt will report >/=50% improvement in his sit to stand transfers per his goal Baseline:  Goal status: INITIAL  PLAN:  PT FREQUENCY: 2x/week  PT DURATION: 8 weeks  PLANNED INTERVENTIONS: 97164- PT Re-evaluation, 97110-Therapeutic exercises, 97530- Therapeutic activity, [id number]- Neuromuscular re-education, 97535- Self Care, [id number]- Manual therapy, [id number]- Gait training, [phone]- Canalith repositioning, [phone]- Aquatic Therapy, Patient/Family education, Stair training, Taping, Joint mobilization, Vestibular training, Cryotherapy, and Moist heat  PLAN FOR NEXT  SESSION: Initiate general strengthening HEP. Work on balance/ankle and hip stability.    Grayce Sessions, PTA 02/01/2023, 3:19 PM

## 2023-02-08 ENCOUNTER — Encounter: Payer: Self-pay | Admitting: Physical Therapy

## 2023-02-08 ENCOUNTER — Ambulatory Visit: Payer: Medicare Other | Admitting: Physical Therapy

## 2023-02-08 DIAGNOSIS — R293 Abnormal posture: Secondary | ICD-10-CM

## 2023-02-08 DIAGNOSIS — M6281 Muscle weakness (generalized): Secondary | ICD-10-CM

## 2023-02-08 DIAGNOSIS — R2681 Unsteadiness on feet: Secondary | ICD-10-CM

## 2023-02-08 DIAGNOSIS — R609 Edema, unspecified: Secondary | ICD-10-CM

## 2023-02-08 DIAGNOSIS — R2689 Other abnormalities of gait and mobility: Secondary | ICD-10-CM

## 2023-02-08 NOTE — Therapy (Signed)
OUTPATIENT PHYSICAL THERAPY LOWER EXTREMITY TREATMENT   Patient Name: Darren Frank MRN: [id number] DOB:1932-02-09, 89 y.o., male Today's Date: 02/08/2023  END OF SESSION:  PT End of Session - 02/08/23 1343     Visit Number 3    Date for PT Re-Evaluation 03/24/23    PT Start Time 1345    PT Stop Time 1430    PT Time Calculation (min) 45 min    Activity Tolerance Patient tolerated treatment well    Behavior During Therapy WFL for tasks assessed/performed             Past Medical History:  Diagnosis Date   Anal polyp    Bradycardia found on measurement of baseline fetal heart rate    Diplopia    Diverticulosis    Hearing loss    High cholesterol    History of radiation therapy    Prostate, done at    Lymph node cancer (HCC)    Lymphoma in remission    OA (osteoarthritis)    knees   Obese    Peripheral neuropathy    Prostate cancer (HCC)    Tinnitus    Vertigo    Past Surgical History:  Procedure Laterality Date   BACK SURGERY     diskectomy   CATARACT EXTRACTION Right    COLONOSCOPY  04/06/2000   no more due to radiation treatment to prostate/lower bowel   lymphatic surgery, left groin     TONSILLECTOMY     Patient Active Problem List   Diagnosis Date Noted   Fever 04/04/2011   Neutropenia, unspecified (HCC) 04/04/2011   Prostate cancer (HCC)    Lymph node cancer (HCC)     PCP: Merri Brunette, MD  REFERRING PROVIDER: Merri Brunette, MD  REFERRING DIAG: R26.9 (ICD-10-CM) - Unspecified abnormalities of gait and mobility  THERAPY DIAG:  Muscle weakness (generalized)  Abnormal posture  Edema, unspecified type  Other abnormalities of gait and mobility  Unsteadiness on feet  Rationale for Evaluation and Treatment: Rehabilitation  ONSET DATE: Chronic >10 years  SUBJECTIVE:   SUBJECTIVE STATEMENT: Good  PERTINENT HISTORY: Cancer, partial lumbar discectomy PAIN:  Are you having pain? No  PRECAUTIONS: None  RED FLAGS: None   WEIGHT  BEARING RESTRICTIONS: No  FALLS:  Has patient fallen in last 6 months? No  LIVING ENVIRONMENT: Lives with: lives with their spouse and lives with their daughter Lives in: House/apartment Stairs: Yes: Internal: 12-13 steps; on right going up Has following equipment at home: Single point cane  OCCUPATION: Retired - likes to watch football, play cards  PLOF: Independent  PATIENT GOALS: Get out of a chair  NEXT MD VISIT: n/a  OBJECTIVE:  Note: Objective measures were completed at Evaluation unless otherwise noted.  DIAGNOSTIC FINDINGS: Nothing recent on Epic  PATIENT SURVEYS:  Lower Extremity Functional Score: 31 / 80 = 38.8 %  COGNITION: Overall cognitive status: Within functional limits for tasks assessed     SENSATION: N/T  EDEMA:  Bilat LE edema evident (states he bought compression stockings)  MUSCLE LENGTH: Did not assess  POSTURE: rounded shoulders, forward head, and flexed trunk   PALPATION: Did not assess  LOWER EXTREMITY ROM:  Active ROM Right eval Left eval  Hip flexion    Hip extension    Hip abduction    Hip adduction    Hip internal rotation    Hip external rotation    Knee flexion    Knee extension    Ankle dorsiflexion  Ankle plantarflexion    Ankle inversion    Ankle eversion     (Blank rows = not tested)  LOWER EXTREMITY MMT:  MMT Right eval Left eval  Hip flexion 3+ 3+  Hip extension 3 3  Hip abduction 3 3  Hip adduction    Hip internal rotation    Hip external rotation    Knee flexion 4 5  Knee extension 5 5  Ankle dorsiflexion 4 4+  Ankle plantarflexion    Ankle inversion    Ankle eversion     (Blank rows = not tested)  LOWER EXTREMITY SPECIAL TESTS:  Unable to assess  TRANSFERS:  Sit to stand: SBA Has to use UEs, braces LEs against table  Stand to sit: SBA has to use UEs  Sit to sidelying: Min A for LE negotiation; left sidelying limited due to pt dizziness  Sidelying to sit: Min A for trunk negotiation  Sit  to supine: Pt unable to perform (pt states he cannot tolerate supine)  Rolling L<>R: Pt states he is unable to perform    FUNCTIONAL TESTS:  5 times sit to stand: 29.31 sec with UE support and bracing LEs against mat table TUG: 17.94 sec   GAIT: Distance walked: Into clinic Assistive device utilized: Single point cane Level of assistance: CGA Comments: diminished heel strike bilaterally (R worse than L), decreased step length bilaterally, forward flexed trunk   TODAY'S TREATMENT:                                                                                                                              DATE:  02/08/23 NuStep L 4 x 6 min Resisted gait 20lb 4 way x 3 each HS curls 45lb 2x10 Leg Ext 10lb 2x10 Sit to stands from elevated mat some without UE 2x10 Alt 6in box taps w/ SPC 2x10  02/01/23 NuStep L 4 x 6 min S2S 2x10 UE on knees elevated mat  HS curls blue 2x10 LAQ 3lb 2x15 Ankle PF/DF blue x15, x10 4in step ups x10, 6 in step ups x10 with both rails Hip add ball squeeze  01/27/23  Did not initiate  PATIENT EDUCATION:  Education details: Exam findings, POC Person educated: Patient Education method: Explanation and Demonstration Education comprehension: verbalized understanding and needs further education  HOME EXERCISE PROGRAM: To be provided  ASSESSMENT:  CLINICAL IMPRESSION: Patient is a 89 y.o. M who was seen today for physical therapy treatment for bilat LE weakness. PMH significant for dizziness, cancer, and prior discectomy. Pt enters doing well. Pt tolerated an progression of TE well evident by no subjective reports of increase pain. CGA needed with resisted gait and alt box taps due to instability. Cue needed for full ROM with leg curls and extensions. .  Pt will benefit from PT to maximize his level of mobility.   OBJECTIVE IMPAIRMENTS: Abnormal gait, decreased activity tolerance, decreased balance, decreased endurance, decreased mobility, difficulty  walking, decreased ROM, decreased  strength, dizziness, increased edema, impaired sensation, improper body mechanics, and postural dysfunction.   ACTIVITY LIMITATIONS: lifting, bending, standing, squatting, stairs, transfers, bed mobility, toileting, and locomotion level  PARTICIPATION LIMITATIONS: meal prep, cleaning, shopping, and community activity  PERSONAL FACTORS: Age, Fitness, Past/current experiences, and Time since onset of injury/illness/exacerbation are also affecting patient's functional outcome.   REHAB POTENTIAL: Good  CLINICAL DECISION MAKING: Evolving/moderate complexity  EVALUATION COMPLEXITY: Moderate   GOALS: Goals reviewed with patient? Yes  SHORT TERM GOALS: Target date: 02/25/2023  Pt will be ind with initial HEP Baseline: Goal status: INITIAL  2.  Pt will be able to perform 5x STS in </=25 sec for improving transfers and functional LE strength Baseline:  Goal status: INITIAL   LONG TERM GOALS: Target date: 03/25/2023   Pt will be ind with progression and management of HEP Baseline:  Goal status: INITIAL  2.  Pt will improve TUG to </=13 sec for reduced fall risk Baseline:  Goal status: INITIAL  3.  Pt will have improved 5x STS to </=20 sec to demo increasing functional LE strength Baseline:  Goal status: INITIAL  4.  Pt will have improved LEFS score to >/=48.8% to demo MCID Baseline: 38.8% Goal status: INITIAL  5.  Pt will report >/=50% improvement in his sit to stand transfers per his goal Baseline:  Goal status: INITIAL  PLAN:  PT FREQUENCY: 2x/week  PT DURATION: 8 weeks  PLANNED INTERVENTIONS: 97164- PT Re-evaluation, 97110-Therapeutic exercises, 97530- Therapeutic activity, [id number]- Neuromuscular re-education, 97535- Self Care, 72536- Manual therapy, [id number]- Gait training, [phone]- Canalith repositioning, [phone]- Aquatic Therapy, Patient/Family education, Stair training, Taping, Joint mobilization, Vestibular training, Cryotherapy, and Moist  heat  PLAN FOR NEXT SESSION: Initiate general strengthening HEP. Work on balance/ankle and hip stability.    Grayce Sessions, PTA 02/08/2023, 1:44 PM

## 2023-02-11 ENCOUNTER — Encounter: Payer: Self-pay | Admitting: Physical Therapy

## 2023-02-11 ENCOUNTER — Ambulatory Visit: Payer: Medicare Other | Admitting: Physical Therapy

## 2023-02-11 DIAGNOSIS — R293 Abnormal posture: Secondary | ICD-10-CM

## 2023-02-11 DIAGNOSIS — R2681 Unsteadiness on feet: Secondary | ICD-10-CM

## 2023-02-11 DIAGNOSIS — M6281 Muscle weakness (generalized): Secondary | ICD-10-CM

## 2023-02-11 NOTE — Therapy (Signed)
OUTPATIENT PHYSICAL THERAPY LOWER EXTREMITY TREATMENT   Patient Name: Darren Frank MRN: [id number] DOB:28-Feb-1932, 89 y.o., male Today's Date: 02/11/2023  END OF SESSION:  PT End of Session - 02/11/23 1053     Visit Number 4    Date for PT Re-Evaluation 03/24/23    PT Start Time 1055    PT Stop Time 1140    PT Time Calculation (min) 45 min    Activity Tolerance Patient tolerated treatment well    Behavior During Therapy WFL for tasks assessed/performed             Past Medical History:  Diagnosis Date   Anal polyp    Bradycardia found on measurement of baseline fetal heart rate    Diplopia    Diverticulosis    Hearing loss    High cholesterol    History of radiation therapy    Prostate, done at    Lymph node cancer (HCC)    Lymphoma in remission    OA (osteoarthritis)    knees   Obese    Peripheral neuropathy    Prostate cancer (HCC)    Tinnitus    Vertigo    Past Surgical History:  Procedure Laterality Date   BACK SURGERY     diskectomy   CATARACT EXTRACTION Right    COLONOSCOPY  04/06/2000   no more due to radiation treatment to prostate/lower bowel   lymphatic surgery, left groin     TONSILLECTOMY     Patient Active Problem List   Diagnosis Date Noted   Fever 04/04/2011   Neutropenia, unspecified (HCC) 04/04/2011   Prostate cancer (HCC)    Lymph node cancer (HCC)     PCP: Merri Brunette, MD  REFERRING PROVIDER: Merri Brunette, MD  REFERRING DIAG: R26.9 (ICD-10-CM) - Unspecified abnormalities of gait and mobility  THERAPY DIAG:  Muscle weakness (generalized)  Abnormal posture  Unsteadiness on feet  Rationale for Evaluation and Treatment: Rehabilitation  ONSET DATE: Chronic >10 years  SUBJECTIVE:   SUBJECTIVE STATEMENT: Good  PERTINENT HISTORY: Cancer, partial lumbar discectomy PAIN:  Are you having pain? No  PRECAUTIONS: None  RED FLAGS: None   WEIGHT BEARING RESTRICTIONS: No  FALLS:  Has patient fallen in last 6  months? No  LIVING ENVIRONMENT: Lives with: lives with their spouse and lives with their daughter Lives in: House/apartment Stairs: Yes: Internal: 12-13 steps; on right going up Has following equipment at home: Single point cane  OCCUPATION: Retired - likes to watch football, play cards  PLOF: Independent  PATIENT GOALS: Get out of a chair  NEXT MD VISIT: n/a  OBJECTIVE:  Note: Objective measures were completed at Evaluation unless otherwise noted.  DIAGNOSTIC FINDINGS: Nothing recent on Epic  PATIENT SURVEYS:  Lower Extremity Functional Score: 31 / 80 = 38.8 %  COGNITION: Overall cognitive status: Within functional limits for tasks assessed     SENSATION: N/T  EDEMA:  Bilat LE edema evident (states he bought compression stockings)  MUSCLE LENGTH: Did not assess  POSTURE: rounded shoulders, forward head, and flexed trunk   PALPATION: Did not assess  LOWER EXTREMITY ROM:  Active ROM Right eval Left eval  Hip flexion    Hip extension    Hip abduction    Hip adduction    Hip internal rotation    Hip external rotation    Knee flexion    Knee extension    Ankle dorsiflexion    Ankle plantarflexion    Ankle inversion    Ankle  eversion     (Blank rows = not tested)  LOWER EXTREMITY MMT:  MMT Right eval Left eval  Hip flexion 3+ 3+  Hip extension 3 3  Hip abduction 3 3  Hip adduction    Hip internal rotation    Hip external rotation    Knee flexion 4 5  Knee extension 5 5  Ankle dorsiflexion 4 4+  Ankle plantarflexion    Ankle inversion    Ankle eversion     (Blank rows = not tested)  LOWER EXTREMITY SPECIAL TESTS:  Unable to assess  TRANSFERS:  Sit to stand: SBA Has to use UEs, braces LEs against table  Stand to sit: SBA has to use UEs  Sit to sidelying: Min A for LE negotiation; left sidelying limited due to pt dizziness  Sidelying to sit: Min A for trunk negotiation  Sit to supine: Pt unable to perform (pt states he cannot tolerate  supine)  Rolling L<>R: Pt states he is unable to perform    FUNCTIONAL TESTS:  5 times sit to stand: 29.31 sec with UE support and bracing LEs against mat table TUG: 17.94 sec   GAIT: Distance walked: Into clinic Assistive device utilized: Single point cane Level of assistance: CGA Comments: diminished heel strike bilaterally (R worse than L), decreased step length bilaterally, forward flexed trunk   TODAY'S TREATMENT:                                                                                                                              DATE:  02/11/23 NuStep L 5 x 6 min Performed all HEP interventions 10 reps  S2S 2x10 elevated mat  4in step ups x 10 each 1 rail, then 6in x10 Rows & Ext green 2x10  HS curls 45lb 2x10 Leg Ext 10lb 2x10  02/08/23 NuStep L 4 x 6 min Resisted gait 20lb 4 way x 3 each HS curls 45lb 2x10 Leg Ext 10lb 2x10 Sit to stands from elevated mat some without UE 2x10 Alt 6in box taps w/ SPC 2x10  02/01/23 NuStep L 4 x 6 min S2S 2x10 UE on knees elevated mat  HS curls blue 2x10 LAQ 3lb 2x15 Ankle PF/DF blue x15, x10 4in step ups x10, 6 in step ups x10 with both rails Hip add ball squeeze  01/27/23  Did not initiate  PATIENT EDUCATION:  Education details: Exam findings, POC Person educated: Patient Education method: Explanation and Demonstration Education comprehension: verbalized understanding and needs further education  HOME EXERCISE PROGRAM: Access Code: [email or web address] URL: [email or web address] Date: 02/11/2023 Prepared by: Debroah Baller  Exercises - Sit to Stand with Hands on Knees  - 1 x daily - 7 x weekly - 3 sets - 10 reps - Standing March with Unilateral Counter Support  - 1 x daily - 7 x weekly - 3 sets - 10 reps - Standing Hip Extension with Unilateral Counter Support  - 1 x daily - 7 x weekly -  3 sets - 10 reps - Standing Knee Flexion with Unilateral Counter Support  - 1 x daily - 7 x weekly - 3 sets - 10  reps  ASSESSMENT:  CLINICAL IMPRESSION: Patient is a 89 y.o. M who was seen today for physical therapy treatment for bilat LE weakness. PMH significant for dizziness, cancer, and prior discectomy. Pt enters doing well. Pt tolerated a progression with functional interventions. Pt had better glute activation with sit to stands.  Cues to lift higher with step ups.  Cue needed for full ROM with leg curls and extensions. Cue to emphasize erect posture with rows and ext. Pt has a forward flexed posture at rest.  Pt will benefit from PT to maximize his level of mobility.   OBJECTIVE IMPAIRMENTS: Abnormal gait, decreased activity tolerance, decreased balance, decreased endurance, decreased mobility, difficulty walking, decreased ROM, decreased strength, dizziness, increased edema, impaired sensation, improper body mechanics, and postural dysfunction.   ACTIVITY LIMITATIONS: lifting, bending, standing, squatting, stairs, transfers, bed mobility, toileting, and locomotion level  PARTICIPATION LIMITATIONS: meal prep, cleaning, shopping, and community activity  PERSONAL FACTORS: Age, Fitness, Past/current experiences, and Time since onset of injury/illness/exacerbation are also affecting patient's functional outcome.   REHAB POTENTIAL: Good  CLINICAL DECISION MAKING: Evolving/moderate complexity  EVALUATION COMPLEXITY: Moderate   GOALS: Goals reviewed with patient? Yes  SHORT TERM GOALS: Target date: 02/25/2023  Pt will be ind with initial HEP Baseline: Goal status: INITIAL  2.  Pt will be able to perform 5x STS in </=25 sec for improving transfers and functional LE strength Baseline:  Goal status: INITIAL   LONG TERM GOALS: Target date: 03/25/2023   Pt will be ind with progression and management of HEP Baseline:  Goal status: INITIAL  2.  Pt will improve TUG to </=13 sec for reduced fall risk Baseline:  Goal status: INITIAL  3.  Pt will have improved 5x STS to </=20 sec to demo  increasing functional LE strength Baseline:  Goal status: INITIAL  4.  Pt will have improved LEFS score to >/=48.8% to demo MCID Baseline: 38.8% Goal status: INITIAL  5.  Pt will report >/=50% improvement in his sit to stand transfers per his goal Baseline:  Goal status: INITIAL  PLAN:  PT FREQUENCY: 2x/week  PT DURATION: 8 weeks  PLANNED INTERVENTIONS: 97164- PT Re-evaluation, 97110-Therapeutic exercises, 97530- Therapeutic activity, [id number]- Neuromuscular re-education, 97535- Self Care, [id number]- Manual therapy, [id number]- Gait training, [phone]- Canalith repositioning, [phone]- Aquatic Therapy, Patient/Family education, Stair training, Taping, Joint mobilization, Vestibular training, Cryotherapy, and Moist heat  PLAN FOR NEXT SESSION: Initiate general strengthening HEP. Work on balance/ankle and hip stability.    Grayce Sessions, PTA 02/11/2023, 10:54 AM

## 2023-02-14 ENCOUNTER — Ambulatory Visit: Payer: Medicare Other | Admitting: Physical Therapy

## 2023-02-21 ENCOUNTER — Ambulatory Visit: Payer: Medicare Other | Attending: Internal Medicine | Admitting: Physical Therapy

## 2023-02-21 ENCOUNTER — Encounter: Payer: Self-pay | Admitting: Physical Therapy

## 2023-02-21 DIAGNOSIS — R2681 Unsteadiness on feet: Secondary | ICD-10-CM | POA: Insufficient documentation

## 2023-02-21 DIAGNOSIS — M6281 Muscle weakness (generalized): Secondary | ICD-10-CM | POA: Insufficient documentation

## 2023-02-21 DIAGNOSIS — R293 Abnormal posture: Secondary | ICD-10-CM | POA: Diagnosis present

## 2023-02-21 NOTE — Therapy (Signed)
OUTPATIENT PHYSICAL THERAPY LOWER EXTREMITY TREATMENT   Patient Name: Darren Frank MRN: [id number] DOB:1932-03-09, 89 y.o., male Today's Date: 02/21/2023  END OF SESSION:  PT End of Session - 02/21/23 1304     Visit Number 5    Date for PT Re-Evaluation 03/24/23    PT Start Time 1300    PT Stop Time 1345    PT Time Calculation (min) 45 min    Activity Tolerance Patient tolerated treatment well    Behavior During Therapy WFL for tasks assessed/performed             Past Medical History:  Diagnosis Date   Anal polyp    Bradycardia found on measurement of baseline fetal heart rate    Diplopia    Diverticulosis    Hearing loss    High cholesterol    History of radiation therapy    Prostate, done at    Lymph node cancer (HCC)    Lymphoma in remission    OA (osteoarthritis)    knees   Obese    Peripheral neuropathy    Prostate cancer (HCC)    Tinnitus    Vertigo    Past Surgical History:  Procedure Laterality Date   BACK SURGERY     diskectomy   CATARACT EXTRACTION Right    COLONOSCOPY  04/06/2000   no more due to radiation treatment to prostate/lower bowel   lymphatic surgery, left groin     TONSILLECTOMY     Patient Active Problem List   Diagnosis Date Noted   Fever 04/04/2011   Neutropenia, unspecified (HCC) 04/04/2011   Prostate cancer (HCC)    Lymph node cancer (HCC)     PCP: Merri Brunette, MD  REFERRING PROVIDER: Merri Brunette, MD  REFERRING DIAG: R26.9 (ICD-10-CM) - Unspecified abnormalities of gait and mobility  THERAPY DIAG:  Muscle weakness (generalized)  Abnormal posture  Unsteadiness on feet  Rationale for Evaluation and Treatment: Rehabilitation  ONSET DATE: Chronic >10 years  SUBJECTIVE:   SUBJECTIVE STATEMENT: "Im ok"  PERTINENT HISTORY: Cancer, partial lumbar discectomy PAIN:  Are you having pain? No  PRECAUTIONS: None  RED FLAGS: None   WEIGHT BEARING RESTRICTIONS: No  FALLS:  Has patient fallen in last 6  months? No  LIVING ENVIRONMENT: Lives with: lives with their spouse and lives with their daughter Lives in: House/apartment Stairs: Yes: Internal: 12-13 steps; on right going up Has following equipment at home: Single point cane  OCCUPATION: Retired - likes to watch football, play cards  PLOF: Independent  PATIENT GOALS: Get out of a chair  NEXT MD VISIT: n/a  OBJECTIVE:  Note: Objective measures were completed at Evaluation unless otherwise noted.  DIAGNOSTIC FINDINGS: Nothing recent on Epic  PATIENT SURVEYS:  Lower Extremity Functional Score: 31 / 80 = 38.8 %  COGNITION: Overall cognitive status: Within functional limits for tasks assessed     SENSATION: N/T  EDEMA:  Bilat LE edema evident (states he bought compression stockings)  MUSCLE LENGTH: Did not assess  POSTURE: rounded shoulders, forward head, and flexed trunk   PALPATION: Did not assess  LOWER EXTREMITY ROM:  Active ROM Right eval Left eval  Hip flexion    Hip extension    Hip abduction    Hip adduction    Hip internal rotation    Hip external rotation    Knee flexion    Knee extension    Ankle dorsiflexion    Ankle plantarflexion    Ankle inversion  Ankle eversion     (Blank rows = not tested)  LOWER EXTREMITY MMT:  MMT Right eval Left eval  Hip flexion 3+ 3+  Hip extension 3 3  Hip abduction 3 3  Hip adduction    Hip internal rotation    Hip external rotation    Knee flexion 4 5  Knee extension 5 5  Ankle dorsiflexion 4 4+  Ankle plantarflexion    Ankle inversion    Ankle eversion     (Blank rows = not tested)  LOWER EXTREMITY SPECIAL TESTS:  Unable to assess  TRANSFERS:  Sit to stand: SBA Has to use UEs, braces LEs against table  Stand to sit: SBA has to use UEs  Sit to sidelying: Min A for LE negotiation; left sidelying limited due to pt dizziness  Sidelying to sit: Min A for trunk negotiation  Sit to supine: Pt unable to perform (pt states he cannot tolerate  supine)  Rolling L<>R: Pt states he is unable to perform    FUNCTIONAL TESTS:  5 times sit to stand: 29.31 sec with UE support and bracing LEs against mat table TUG: 17.94 sec   GAIT: Distance walked: Into clinic Assistive device utilized: Single point cane Level of assistance: CGA Comments: diminished heel strike bilaterally (R worse than L), decreased step length bilaterally, forward flexed trunk   TODAY'S TREATMENT:                                                                                                                              DATE:  02/21/23 NuStep L 5 x 7 min HS curls 45lb 2x12 Leg Ext 10lb 2x12 S2S 2x10 elevated mat  Fitter press 1 blue 1 black 2x15 6in step ups forward and lateral 1 rail x10  Seated OHP yellow ball 2x10  02/11/23 NuStep L 5 x 6 min Performed all HEP interventions 10 reps  S2S 2x10 elevated mat  4in step ups x 10 each 1 rail, then 6in x10 Rows & Ext green 2x10  HS curls 45lb 2x10 Leg Ext 10lb 2x10  02/08/23 NuStep L 4 x 6 min Resisted gait 20lb 4 way x 3 each HS curls 45lb 2x10 Leg Ext 10lb 2x10 Sit to stands from elevated mat some without UE 2x10 Alt 6in box taps w/ SPC 2x10  02/01/23 NuStep L 4 x 6 min S2S 2x10 UE on knees elevated mat  HS curls blue 2x10 LAQ 3lb 2x15 Ankle PF/DF blue x15, x10 4in step ups x10, 6 in step ups x10 with both rails Hip add ball squeeze  01/27/23  Did not initiate  PATIENT EDUCATION:  Education details: Exam findings, POC Person educated: Patient Education method: Explanation and Demonstration Education comprehension: verbalized understanding and needs further education  HOME EXERCISE PROGRAM: Access Code: [email or web address] URL: [email or web address] Date: 02/11/2023 Prepared by: Debroah Baller  Exercises - Sit to Stand with Hands on Knees  - 1 x daily - 7 x weekly -  3 sets - 10 reps - Standing March with Unilateral Counter Support  - 1 x daily - 7 x weekly - 3 sets - 10 reps -  Standing Hip Extension with Unilateral Counter Support  - 1 x daily - 7 x weekly - 3 sets - 10 reps - Standing Knee Flexion with Unilateral Counter Support  - 1 x daily - 7 x weekly - 3 sets - 10 reps  ASSESSMENT:  CLINICAL IMPRESSION: Patient is a 89 y.o. M who was seen today for physical therapy treatment for bilat LE weakness. PMH significant for dizziness, cancer, and prior discectomy. Pt enters doing well. RLE functional weakness exposed with lateral step ups.   Cue needed for full ROM with leg curls and extensions. Cue to emphasize glute activation with sit to stands to bring his forward.. Pt has a forward flexed posture at rest.  Pt will benefit from PT to maximize his level of mobility.   OBJECTIVE IMPAIRMENTS: Abnormal gait, decreased activity tolerance, decreased balance, decreased endurance, decreased mobility, difficulty walking, decreased ROM, decreased strength, dizziness, increased edema, impaired sensation, improper body mechanics, and postural dysfunction.   ACTIVITY LIMITATIONS: lifting, bending, standing, squatting, stairs, transfers, bed mobility, toileting, and locomotion level  PARTICIPATION LIMITATIONS: meal prep, cleaning, shopping, and community activity  PERSONAL FACTORS: Age, Fitness, Past/current experiences, and Time since onset of injury/illness/exacerbation are also affecting patient's functional outcome.   REHAB POTENTIAL: Good  CLINICAL DECISION MAKING: Evolving/moderate complexity  EVALUATION COMPLEXITY: Moderate   GOALS: Goals reviewed with patient? Yes  SHORT TERM GOALS: Target date: 02/25/2023  Pt will be ind with initial HEP Baseline: Goal status: Met 02/21/23  2.  Pt will be able to perform 5x STS in </=25 sec for improving transfers and functional LE strength Baseline:  Goal status: on going 02/21/23    LONG TERM GOALS: Target date: 03/25/2023   Pt will be ind with progression and management of HEP Baseline:  Goal status: INITIAL  2.  Pt  will improve TUG to </=13 sec for reduced fall risk Baseline:  Goal status: INITIAL  3.  Pt will have improved 5x STS to </=20 sec to demo increasing functional LE strength Baseline:  Goal status: INITIAL  4.  Pt will have improved LEFS score to >/=48.8% to demo MCID Baseline: 38.8% Goal status: INITIAL  5.  Pt will report >/=50% improvement in his sit to stand transfers per his goal Baseline:  Goal status: INITIAL  PLAN:  PT FREQUENCY: 2x/week  PT DURATION: 8 weeks  PLANNED INTERVENTIONS: 97164- PT Re-evaluation, 97110-Therapeutic exercises, 97530- Therapeutic activity, [id number]- Neuromuscular re-education, 97535- Self Care, [id number]- Manual therapy, [id number]- Gait training, [phone]- Canalith repositioning, [phone]- Aquatic Therapy, Patient/Family education, Stair training, Taping, Joint mobilization, Vestibular training, Cryotherapy, and Moist heat  PLAN FOR NEXT SESSION: Initiate general strengthening HEP. Work on balance/ankle and hip stability.    Grayce Sessions, PTA 02/21/2023, 1:05 PM

## 2023-02-24 ENCOUNTER — Ambulatory Visit: Payer: Medicare Other | Admitting: Physical Therapy

## 2023-02-24 DIAGNOSIS — R293 Abnormal posture: Secondary | ICD-10-CM

## 2023-02-24 DIAGNOSIS — M6281 Muscle weakness (generalized): Secondary | ICD-10-CM | POA: Diagnosis not present

## 2023-02-24 DIAGNOSIS — R2681 Unsteadiness on feet: Secondary | ICD-10-CM

## 2023-02-24 NOTE — Therapy (Signed)
OUTPATIENT PHYSICAL THERAPY LOWER EXTREMITY TREATMENT   Patient Name: Agee Serafini MRN: [id number] DOB:[birth year], 89 y.o., male Today's Date: 02/24/2023  END OF SESSION:  PT End of Session - 02/24/23 0929     Visit Number 6    Date for PT Re-Evaluation 03/24/23    Authorization Type [employer]    Progress Note Due on Visit 10    PT Start Time 0930    PT Stop Time 1010    PT Time Calculation (min) 40 min    Activity Tolerance Patient tolerated treatment well    Behavior During Therapy WFL for tasks assessed/performed              Past Medical History:  Diagnosis Date   Anal polyp    Bradycardia found on measurement of baseline fetal heart rate    Diplopia    Diverticulosis    Hearing loss    High cholesterol    History of radiation therapy    Prostate, done at    Lymph node cancer (HCC)    Lymphoma in remission    OA (osteoarthritis)    knees   Obese    Peripheral neuropathy    Prostate cancer (HCC)    Tinnitus    Vertigo    Past Surgical History:  Procedure Laterality Date   BACK SURGERY     diskectomy   CATARACT EXTRACTION Right    COLONOSCOPY  04/06/2000   no more due to radiation treatment to prostate/lower bowel   lymphatic surgery, left groin     TONSILLECTOMY     Patient Active Problem List   Diagnosis Date Noted   Fever 04/04/2011   Neutropenia, unspecified (HCC) 04/04/2011   Prostate cancer (HCC)    Lymph node cancer (HCC)     PCP: Merri Brunette, MD  REFERRING PROVIDER: Merri Brunette, MD  REFERRING DIAG: R26.9 (ICD-10-CM) - Unspecified abnormalities of gait and mobility  THERAPY DIAG:  No diagnosis found.  Rationale for Evaluation and Treatment: Rehabilitation  ONSET DATE: Chronic >10 years  SUBJECTIVE:   SUBJECTIVE STATEMENT: Pt states he doesn't have any more PT visits.   PERTINENT HISTORY: Cancer, partial lumbar discectomy PAIN:  Are you having pain? No  PRECAUTIONS: None  RED FLAGS: None   WEIGHT BEARING  RESTRICTIONS: No  FALLS:  Has patient fallen in last 6 months? No  LIVING ENVIRONMENT: Lives with: lives with their spouse and lives with their daughter Lives in: House/apartment Stairs: Yes: Internal: 12-13 steps; on right going up Has following equipment at home: Single point cane  OCCUPATION: Retired - likes to watch football, play cards  PLOF: Independent  PATIENT GOALS: Get out of a chair  NEXT MD VISIT: n/a  OBJECTIVE:  Note: Objective measures were completed at Evaluation unless otherwise noted.  DIAGNOSTIC FINDINGS: Nothing recent on Epic  PATIENT SURVEYS:  Lower Extremity Functional Score: 31 / 80 = 38.8 %  EDEMA:  Bilat LE edema evident (states he bought compression stockings)  MUSCLE LENGTH: Did not assess  POSTURE: rounded shoulders, forward head, and flexed trunk   LOWER EXTREMITY ROM:  Active ROM Right eval Left eval  Hip flexion    Hip extension    Hip abduction    Hip adduction    Hip internal rotation    Hip external rotation    Knee flexion    Knee extension    Ankle dorsiflexion    Ankle plantarflexion    Ankle inversion    Ankle eversion     (  Blank rows = not tested)  LOWER EXTREMITY MMT:  MMT Right eval Left eval  Hip flexion 3+ 3+  Hip extension 3 3  Hip abduction 3 3  Hip adduction    Hip internal rotation    Hip external rotation    Knee flexion 4 5  Knee extension 5 5  Ankle dorsiflexion 4 4+  Ankle plantarflexion    Ankle inversion    Ankle eversion     (Blank rows = not tested)  LOWER EXTREMITY SPECIAL TESTS:  Unable to assess  TRANSFERS:  Sit to stand: SBA Has to use UEs, braces LEs against table  Stand to sit: SBA has to use UEs  Sit to sidelying: Min A for LE negotiation; left sidelying limited due to pt dizziness  Sidelying to sit: Min A for trunk negotiation  Sit to supine: Pt unable to perform (pt states he cannot tolerate supine)  Rolling L<>R: Pt states he is unable to perform    FUNCTIONAL TESTS:   5 times sit to stand: 29.31 sec with UE support and bracing LEs against mat table  (eval) 24.41 sec with UE support (02/24/23) TUG: 17.94 sec (eval)  GAIT: Distance walked: Into clinic Assistive device utilized: Single point cane Level of assistance: CGA Comments: diminished heel strike bilaterally (R worse than L), decreased step length bilaterally, forward flexed trunk   TODAY'S TREATMENT:                                                                                                                              DATE:  02/24/23 Nustep L5 x 5 min Partial sit to stands with holds 5x5" Counter mini squats x10 Forward step up 4" x10 with bilat UE support, 6" x10 with bilat UE support Side step up 4" x10 with UE support Slow marching x10 Hip ext yellow TB 2x10 Hip abd yellow TB 2x10 Staggered stance with arms overhead x10 R&L  02/21/23 NuStep L 5 x 7 min HS curls 45lb 2x12 Leg Ext 10lb 2x12 S2S 2x10 elevated mat  Fitter press 1 blue 1 black 2x15 6in step ups forward and lateral 1 rail x10  Seated OHP yellow ball 2x10  02/11/23 NuStep L 5 x 6 min Performed all HEP interventions 10 reps  S2S 2x10 elevated mat  4in step ups x 10 each 1 rail, then 6in x10 Rows & Ext green 2x10  HS curls 45lb 2x10 Leg Ext 10lb 2x10  02/08/23 NuStep L 4 x 6 min Resisted gait 20lb 4 way x 3 each HS curls 45lb 2x10 Leg Ext 10lb 2x10 Sit to stands from elevated mat some without UE 2x10 Alt 6in box taps w/ SPC 2x10  02/01/23 NuStep L 4 x 6 min S2S 2x10 UE on knees elevated mat  HS curls blue 2x10 LAQ 3lb 2x15 Ankle PF/DF blue x15, x10 4in step ups x10, 6 in step ups x10 with both rails Hip add ball squeeze  01/27/23  Did not initiate  PATIENT EDUCATION:  Education details: Exam findings, POC Person educated: Patient Education method: Explanation and Demonstration Education comprehension: verbalized understanding and needs further education  HOME EXERCISE PROGRAM: Access Code:  [email or web address] URL: [email or web address] Date: 02/11/2023 Prepared by: Debroah Baller  Exercises - Sit to Stand with Hands on Knees  - 1 x daily - 7 x weekly - 3 sets - 10 reps - Standing March with Unilateral Counter Support  - 1 x daily - 7 x weekly - 3 sets - 10 reps - Standing Hip Extension with Unilateral Counter Support  - 1 x daily - 7 x weekly - 3 sets - 10 reps - Standing Knee Flexion with Unilateral Counter Support  - 1 x daily - 7 x weekly - 3 sets - 10 reps  ASSESSMENT:  CLINICAL IMPRESSION: Patient is a 89 y.o. M who was seen today for physical therapy treatment for bilat LE weakness. PMH significant for dizziness, cancer, and prior discectomy. Checked pt's STGs. Pt has met his STGs but still requires use of UEs for sit to stands. Pt reports difficulty doing the sit to stand exercises at home so modified this to counter squats   Pt enters doing well. RLE functional weakness exposed with lateral step ups.   Cue needed for full ROM with leg curls and extensions. Cue to emphasize glute activation with sit to stands to bring his forward.. Pt has a forward flexed posture at rest.  Pt will benefit from PT to maximize his level of mobility.   OBJECTIVE IMPAIRMENTS: Abnormal gait, decreased activity tolerance, decreased balance, decreased endurance, decreased mobility, difficulty walking, decreased ROM, decreased strength, dizziness, increased edema, impaired sensation, improper body mechanics, and postural dysfunction.   ACTIVITY LIMITATIONS: lifting, bending, standing, squatting, stairs, transfers, bed mobility, toileting, and locomotion level  PARTICIPATION LIMITATIONS: meal prep, cleaning, shopping, and community activity  PERSONAL FACTORS: Age, Fitness, Past/current experiences, and Time since onset of injury/illness/exacerbation are also affecting patient's functional outcome.   REHAB POTENTIAL: Good  CLINICAL DECISION MAKING: Evolving/moderate  complexity  EVALUATION COMPLEXITY: Moderate   GOALS: Goals reviewed with patient? Yes  SHORT TERM GOALS: Target date: 02/25/2023  Pt will be ind with initial HEP Baseline: Goal status: Met 02/21/23  2.  Pt will be able to perform 5x STS in </=25 sec for improving transfers and functional LE strength Baseline:  Goal status: Met 02/24/23    LONG TERM GOALS: Target date: 03/25/2023   Pt will be ind with progression and management of HEP Baseline:  Goal status: INITIAL  2.  Pt will improve TUG to </=13 sec for reduced fall risk Baseline:  Goal status: INITIAL  3.  Pt will have improved 5x STS to </=20 sec to demo increasing functional LE strength Baseline:  Goal status: INITIAL  4.  Pt will have improved LEFS score to >/=48.8% to demo MCID Baseline: 38.8% Goal status: INITIAL  5.  Pt will report >/=50% improvement in his sit to stand transfers per his goal Baseline:  Goal status: INITIAL  PLAN:  PT FREQUENCY: 2x/week  PT DURATION: 8 weeks  PLANNED INTERVENTIONS: 97164- PT Re-evaluation, 97110-Therapeutic exercises, 97530- Therapeutic activity, [id number]- Neuromuscular re-education, 97535- Self Care, [id number]- Manual therapy, [id number]- Gait training, [phone]- Canalith repositioning, [phone]- Aquatic Therapy, Patient/Family education, Stair training, Taping, Joint mobilization, Vestibular training, Cryotherapy, and Moist heat  PLAN FOR NEXT SESSION: Initiate general strengthening HEP. Work on balance/ankle and hip stability.    Meryl Hubers April Ma L Dagen Beevers, PT 02/24/2023, 9:29 AM

## 2023-03-17 ENCOUNTER — Ambulatory Visit: Payer: Medicare Other | Admitting: Physical Therapy

## 2023-03-17 DIAGNOSIS — R2681 Unsteadiness on feet: Secondary | ICD-10-CM

## 2023-03-17 DIAGNOSIS — R293 Abnormal posture: Secondary | ICD-10-CM

## 2023-03-17 DIAGNOSIS — M6281 Muscle weakness (generalized): Secondary | ICD-10-CM | POA: Diagnosis not present

## 2023-03-17 NOTE — Therapy (Signed)
OUTPATIENT PHYSICAL THERAPY LOWER EXTREMITY TREATMENT   Patient Name: Darren Frank MRN: [id number] DOB:[birth year], 89 y.o., male Today's Date: 03/21/2023  END OF SESSION:  PT End of Session - 03/21/23 1015     Visit Number 8    Date for PT Re-Evaluation 03/24/23    Authorization Type [employer]    Progress Note Due on Visit 10    PT Start Time 1015    PT Stop Time 1100    PT Time Calculation (min) 45 min    Activity Tolerance Patient tolerated treatment well    Behavior During Therapy WFL for tasks assessed/performed                Past Medical History:  Diagnosis Date   Anal polyp    Bradycardia found on measurement of baseline fetal heart rate    Diplopia    Diverticulosis    Hearing loss    High cholesterol    History of radiation therapy    Prostate, done at    Lymph node cancer (HCC)    Lymphoma in remission    OA (osteoarthritis)    knees   Obese    Peripheral neuropathy    Prostate cancer (HCC)    Tinnitus    Vertigo    Past Surgical History:  Procedure Laterality Date   BACK SURGERY     diskectomy   CATARACT EXTRACTION Right    COLONOSCOPY  04/06/2000   no more due to radiation treatment to prostate/lower bowel   lymphatic surgery, left groin     TONSILLECTOMY     Patient Active Problem List   Diagnosis Date Noted   Fever 04/04/2011   Neutropenia, unspecified (HCC) 04/04/2011   Prostate cancer (HCC)    Lymph node cancer (HCC)     PCP: Merri Brunette, MD  REFERRING PROVIDER: Merri Brunette, MD  REFERRING DIAG: R26.9 (ICD-10-CM) - Unspecified abnormalities of gait and mobility  THERAPY DIAG:  Muscle weakness (generalized)  Abnormal posture  Unsteadiness on feet  Rationale for Evaluation and Treatment: Rehabilitation  ONSET DATE: Chronic >10 years  SUBJECTIVE:   SUBJECTIVE STATEMENT: Did not do much of my rehab due to holiday stuff going on. No falls.   PERTINENT HISTORY: Cancer, partial lumbar discectomy PAIN:  Are  you having pain? No  PRECAUTIONS: None  RED FLAGS: None   WEIGHT BEARING RESTRICTIONS: No  FALLS:  Has patient fallen in last 6 months? No  LIVING ENVIRONMENT: Lives with: lives with their spouse and lives with their daughter Lives in: House/apartment Stairs: Yes: Internal: 12-13 steps; on right going up Has following equipment at home: Single point cane  OCCUPATION: Retired - likes to watch football, play cards  PLOF: Independent  PATIENT GOALS: Get out of a chair  NEXT MD VISIT: n/a  OBJECTIVE:  Note: Objective measures were completed at Evaluation unless otherwise noted.  DIAGNOSTIC FINDINGS: Nothing recent on Epic  PATIENT SURVEYS:  Lower Extremity Functional Score: 31 / 80 = 38.8 %  EDEMA:  Bilat LE edema evident (states he bought compression stockings)  MUSCLE LENGTH: Did not assess  POSTURE: rounded shoulders, forward head, and flexed trunk   LOWER EXTREMITY ROM:  Active ROM Right eval Left eval  Hip flexion    Hip extension    Hip abduction    Hip adduction    Hip internal rotation    Hip external rotation    Knee flexion    Knee extension    Ankle dorsiflexion  Ankle plantarflexion    Ankle inversion    Ankle eversion     (Blank rows = not tested)  LOWER EXTREMITY MMT:  MMT Right eval Left eval  Hip flexion 3+ 3+  Hip extension 3 3  Hip abduction 3 3  Hip adduction    Hip internal rotation    Hip external rotation    Knee flexion 4 5  Knee extension 5 5  Ankle dorsiflexion 4 4+  Ankle plantarflexion    Ankle inversion    Ankle eversion     (Blank rows = not tested)  LOWER EXTREMITY SPECIAL TESTS:  Unable to assess  TRANSFERS:  Sit to stand: SBA Has to use UEs, braces LEs against table  Stand to sit: SBA has to use UEs  Sit to sidelying: Min A for LE negotiation; left sidelying limited due to pt dizziness  Sidelying to sit: Min A for trunk negotiation  Sit to supine: Pt unable to perform (pt states he cannot tolerate  supine)  Rolling L<>R: Pt states he is unable to perform    FUNCTIONAL TESTS:  5 times sit to stand: 29.31 sec with UE support and bracing LEs against mat table  (eval) 24.41 sec with UE support (02/24/23) TUG: 17.94 sec (eval)  GAIT: Distance walked: Into clinic Assistive device utilized: Single point cane Level of assistance: CGA Comments: diminished heel strike bilaterally (R worse than L), decreased step length bilaterally, forward flexed trunk   TODAY'S TREATMENT:                                                                                                                              DATE:  03/21/23 NuStep L 5 x S2S 2x8 from elevated mat Fitter press 2 blue bands 2x10 HS curls 45lb 2x12 Leg Ext 10lb 2x12 6in step ups forward 1 handrail x10 each side   03/17/23 Nustep L6 x 5 min Wall slide 2x10 Modified deadlift with cues for hip hinge in staggered standing x10 Standing hip ext iso against wall 10x5"  Forearm wall plank 2x20" Forearm wall plank with arm raises 2x5 Standing against wall scap squeeze 2x10 Staggered stance 2x20" Single leg stance with intermittent LE support 2x10   02/24/23 Nustep L5 x 5 min Partial sit to stands with holds 5x5" Counter mini squats x10 Forward step up 4" x10 with bilat UE support, 6" x10 with bilat UE support Side step up 4" x10 with UE support Slow marching x10 Hip ext yellow TB 2x10 Hip abd yellow TB 2x10 Staggered stance with arms overhead x10 R&L  02/21/23 NuStep L 5 x 7 min HS curls 45lb 2x12 Leg Ext 10lb 2x12 S2S 2x10 elevated mat  Fitter press 1 blue 1 black 2x15 6in step ups forward and lateral 1 rail x10  Seated OHP yellow ball 2x10  02/11/23 NuStep L 5 x 6 min Performed all HEP interventions 10 reps  S2S 2x10 elevated mat  4in step ups  x 10 each 1 rail, then 6in x10 Rows & Ext green 2x10  HS curls 45lb 2x10 Leg Ext 10lb 2x10  02/08/23 NuStep L 4 x 6 min Resisted gait 20lb 4 way x 3 each HS curls  45lb 2x10 Leg Ext 10lb 2x10 Sit to stands from elevated mat some without UE 2x10 Alt 6in box taps w/ SPC 2x10  02/01/23 NuStep L 4 x 6 min S2S 2x10 UE on knees elevated mat  HS curls blue 2x10 LAQ 3lb 2x15 Ankle PF/DF blue x15, x10 4in step ups x10, 6 in step ups x10 with both rails Hip add ball squeeze  01/27/23  Did not initiate  PATIENT EDUCATION:  Education details: Exam findings, POC Person educated: Patient Education method: Explanation and Demonstration Education comprehension: verbalized understanding and needs further education  HOME EXERCISE PROGRAM: Access Code: [email or web address] URL: [email or web address] Date: 02/11/2023 Prepared by: Debroah Baller  Exercises - Sit to Stand with Hands on Knees  - 1 x daily - 7 x weekly - 3 sets - 10 reps - Standing March with Unilateral Counter Support  - 1 x daily - 7 x weekly - 3 sets - 10 reps - Standing Hip Extension with Unilateral Counter Support  - 1 x daily - 7 x weekly - 3 sets - 10 reps - Standing Knee Flexion with Unilateral Counter Support  - 1 x daily - 7 x weekly - 3 sets - 10 reps  ASSESSMENT:  CLINICAL IMPRESSION: Patient is a 89 y.o. M who was seen today for physical therapy treatment for bilat LE weakness. PMH significant for dizziness, cancer, and prior discectomy. Pt states he has not been 100% compliant with his HEP due to holidays. Still has the most difficulty with STS, reports this is his biggest problem and also laying flat and rolling in bed. He is sleeping in a recliner. Does well the second set for STS. Continue to progress, will recheck goals next visit.   OBJECTIVE IMPAIRMENTS: Abnormal gait, decreased activity tolerance, decreased balance, decreased endurance, decreased mobility, difficulty walking, decreased ROM, decreased strength, dizziness, increased edema, impaired sensation, improper body mechanics, and postural dysfunction.     GOALS: Goals reviewed with patient? Yes  SHORT TERM  GOALS: Target date: 02/25/2023  Pt will be ind with initial HEP Baseline: Goal status: Met 02/21/23  2.  Pt will be able to perform 5x STS in </=25 sec for improving transfers and functional LE strength Baseline:  Goal status: Met 02/24/23    LONG TERM GOALS: Target date: 03/25/2023   Pt will be ind with progression and management of HEP Baseline:  Goal status: INITIAL  2.  Pt will improve TUG to </=13 sec for reduced fall risk Baseline:  Goal status: INITIAL  3.  Pt will have improved 5x STS to </=20 sec to demo increasing functional LE strength Baseline:  Goal status: INITIAL  4.  Pt will have improved LEFS score to >/=48.8% to demo MCID Baseline: 38.8% Goal status: INITIAL  5.  Pt will report >/=50% improvement in his sit to stand transfers per his goal Baseline:  Goal status: INITIAL  PLAN:  PT FREQUENCY: 2x/week  PT DURATION: 8 weeks  PLANNED INTERVENTIONS: 97164- PT Re-evaluation, 97110-Therapeutic exercises, 97530- Therapeutic activity, 97112- Neuromuscular re-education, 97535- Self Care, 95621- Manual therapy, [id number]- Gait training, [phone]- Canalith repositioning, [phone]- Aquatic Therapy, Patient/Family education, Stair training, Taping, Joint mobilization, Vestibular training, Cryotherapy, and Moist heat  PLAN FOR NEXT SESSION: recheck goals, recert if necessary    [facility]  Tabari Volkert, PT 03/21/2023, 11:00 AM

## 2023-03-17 NOTE — Therapy (Signed)
OUTPATIENT PHYSICAL THERAPY LOWER EXTREMITY TREATMENT   Patient Name: Darren Frank MRN: [id number] DOB:[birth year], 89 y.o., male Today's Date: 03/17/2023  END OF SESSION:  PT End of Session - 03/17/23 1428     Visit Number 7    Date for PT Re-Evaluation 03/24/23    Authorization Type [employer] Medicare    Progress Note Due on Visit 10    PT Start Time 1430    PT Stop Time 1510    PT Time Calculation (min) 40 min    Activity Tolerance Patient tolerated treatment well    Behavior During Therapy WFL for tasks assessed/performed               Past Medical History:  Diagnosis Date   Anal polyp    Bradycardia found on measurement of baseline fetal heart rate    Diplopia    Diverticulosis    Hearing loss    High cholesterol    History of radiation therapy    Prostate, done at    Lymph node cancer (HCC)    Lymphoma in remission    OA (osteoarthritis)    knees   Obese    Peripheral neuropathy    Prostate cancer (HCC)    Tinnitus    Vertigo    Past Surgical History:  Procedure Laterality Date   BACK SURGERY     diskectomy   CATARACT EXTRACTION Right    COLONOSCOPY  04/06/2000   no more due to radiation treatment to prostate/lower bowel   lymphatic surgery, left groin     TONSILLECTOMY     Patient Active Problem List   Diagnosis Date Noted   Fever 04/04/2011   Neutropenia, unspecified (HCC) 04/04/2011   Prostate cancer (HCC)    Lymph node cancer (HCC)     PCP: Merri Brunette, MD  REFERRING PROVIDER: Merri Brunette, MD  REFERRING DIAG: R26.9 (ICD-10-CM) - Unspecified abnormalities of gait and mobility  THERAPY DIAG:  No diagnosis found.  Rationale for Evaluation and Treatment: Rehabilitation  ONSET DATE: Chronic >10 years  SUBJECTIVE:   SUBJECTIVE STATEMENT: Pt reports he had a good Christmas -- was not able to do all of his exercises. He states he was able to do some.   PERTINENT HISTORY: Cancer, partial lumbar discectomy PAIN:  Are you having  pain? No  PRECAUTIONS: None  RED FLAGS: None   WEIGHT BEARING RESTRICTIONS: No  FALLS:  Has patient fallen in last 6 months? No  LIVING ENVIRONMENT: Lives with: lives with their spouse and lives with their daughter Lives in: House/apartment Stairs: Yes: Internal: 12-13 steps; on right going up Has following equipment at home: Single point cane  OCCUPATION: Retired - likes to watch football, play cards  PLOF: Independent  PATIENT GOALS: Get out of a chair  NEXT MD VISIT: n/a  OBJECTIVE:  Note: Objective measures were completed at Evaluation unless otherwise noted.  DIAGNOSTIC FINDINGS: Nothing recent on Epic  PATIENT SURVEYS:  Lower Extremity Functional Score: 31 / 80 = 38.8 %  EDEMA:  Bilat LE edema evident (states he bought compression stockings)  MUSCLE LENGTH: Did not assess  POSTURE: rounded shoulders, forward head, and flexed trunk   LOWER EXTREMITY ROM:  Active ROM Right eval Left eval  Hip flexion    Hip extension    Hip abduction    Hip adduction    Hip internal rotation    Hip external rotation    Knee flexion    Knee extension    Ankle dorsiflexion  Ankle plantarflexion    Ankle inversion    Ankle eversion     (Blank rows = not tested)  LOWER EXTREMITY MMT:  MMT Right eval Left eval  Hip flexion 3+ 3+  Hip extension 3 3  Hip abduction 3 3  Hip adduction    Hip internal rotation    Hip external rotation    Knee flexion 4 5  Knee extension 5 5  Ankle dorsiflexion 4 4+  Ankle plantarflexion    Ankle inversion    Ankle eversion     (Blank rows = not tested)  LOWER EXTREMITY SPECIAL TESTS:  Unable to assess  TRANSFERS:  Sit to stand: SBA Has to use UEs, braces LEs against table  Stand to sit: SBA has to use UEs  Sit to sidelying: Min A for LE negotiation; left sidelying limited due to pt dizziness  Sidelying to sit: Min A for trunk negotiation  Sit to supine: Pt unable to perform (pt states he cannot tolerate  supine)  Rolling L<>R: Pt states he is unable to perform    FUNCTIONAL TESTS:  5 times sit to stand: 29.31 sec with UE support and bracing LEs against mat table  (eval) 24.41 sec with UE support (02/24/23) TUG: 17.94 sec (eval)  GAIT: Distance walked: Into clinic Assistive device utilized: Single point cane Level of assistance: CGA Comments: diminished heel strike bilaterally (R worse than L), decreased step length bilaterally, forward flexed trunk   TODAY'S TREATMENT:                                                                                                                              DATE:  03/17/23 Nustep L6 x 5 min Wall slide 2x10 Modified deadlift with cues for hip hinge in staggered standing x10 Standing hip ext iso against wall 10x5"  Forearm wall plank 2x20" Forearm wall plank with arm raises 2x5 Standing against wall scap squeeze 2x10 Staggered stance 2x20" Single leg stance with intermittent LE support 2x10   02/24/23 Nustep L5 x 5 min Partial sit to stands with holds 5x5" Counter mini squats x10 Forward step up 4" x10 with bilat UE support, 6" x10 with bilat UE support Side step up 4" x10 with UE support Slow marching x10 Hip ext yellow TB 2x10 Hip abd yellow TB 2x10 Staggered stance with arms overhead x10 R&L  02/21/23 NuStep L 5 x 7 min HS curls 45lb 2x12 Leg Ext 10lb 2x12 S2S 2x10 elevated mat  Fitter press 1 blue 1 black 2x15 6in step ups forward and lateral 1 rail x10  Seated OHP yellow ball 2x10  02/11/23 NuStep L 5 x 6 min Performed all HEP interventions 10 reps  S2S 2x10 elevated mat  4in step ups x 10 each 1 rail, then 6in x10 Rows & Ext green 2x10  HS curls 45lb 2x10 Leg Ext 10lb 2x10  02/08/23 NuStep L 4 x 6 min Resisted gait 20lb 4 way x  3 each HS curls 45lb 2x10 Leg Ext 10lb 2x10 Sit to stands from elevated mat some without UE 2x10 Alt 6in box taps w/ SPC 2x10  02/01/23 NuStep L 4 x 6 min S2S 2x10 UE on knees elevated mat   HS curls blue 2x10 LAQ 3lb 2x15 Ankle PF/DF blue x15, x10 4in step ups x10, 6 in step ups x10 with both rails Hip add ball squeeze  01/27/23  Did not initiate  PATIENT EDUCATION:  Education details: Exam findings, POC Person educated: Patient Education method: Explanation and Demonstration Education comprehension: verbalized understanding and needs further education  HOME EXERCISE PROGRAM: Access Code: [email or web address] URL: [email or web address] Date: 02/11/2023 Prepared by: Debroah Baller  Exercises - Sit to Stand with Hands on Knees  - 1 x daily - 7 x weekly - 3 sets - 10 reps - Standing March with Unilateral Counter Support  - 1 x daily - 7 x weekly - 3 sets - 10 reps - Standing Hip Extension with Unilateral Counter Support  - 1 x daily - 7 x weekly - 3 sets - 10 reps - Standing Knee Flexion with Unilateral Counter Support  - 1 x daily - 7 x weekly - 3 sets - 10 reps  ASSESSMENT:  CLINICAL IMPRESSION: Patient is a 89 y.o. M who was seen today for physical therapy treatment for bilat LE weakness. PMH significant for dizziness, cancer, and prior discectomy. Pt states he has not been 100% compliant with his HEP. Encouraged pt to be more consistent with his exercises at home. Progressed pt to wall squats for continued glute/quad strengthening for improved sit to stands. Added some standing postural exercises/core work.    OBJECTIVE IMPAIRMENTS: Abnormal gait, decreased activity tolerance, decreased balance, decreased endurance, decreased mobility, difficulty walking, decreased ROM, decreased strength, dizziness, increased edema, impaired sensation, improper body mechanics, and postural dysfunction.     GOALS: Goals reviewed with patient? Yes  SHORT TERM GOALS: Target date: 02/25/2023  Pt will be ind with initial HEP Baseline: Goal status: Met 02/21/23  2.  Pt will be able to perform 5x STS in </=25 sec for improving transfers and functional LE strength Baseline:   Goal status: Met 02/24/23    LONG TERM GOALS: Target date: 03/25/2023   Pt will be ind with progression and management of HEP Baseline:  Goal status: INITIAL  2.  Pt will improve TUG to </=13 sec for reduced fall risk Baseline:  Goal status: INITIAL  3.  Pt will have improved 5x STS to </=20 sec to demo increasing functional LE strength Baseline:  Goal status: INITIAL  4.  Pt will have improved LEFS score to >/=48.8% to demo MCID Baseline: 38.8% Goal status: INITIAL  5.  Pt will report >/=50% improvement in his sit to stand transfers per his goal Baseline:  Goal status: INITIAL  PLAN:  PT FREQUENCY: 2x/week  PT DURATION: 8 weeks  PLANNED INTERVENTIONS: 97164- PT Re-evaluation, 97110-Therapeutic exercises, 97530- Therapeutic activity, [id number]- Neuromuscular re-education, 97535- Self Care, 69629- Manual therapy, [id number]- Gait training, [phone]- Canalith repositioning, [phone]- Aquatic Therapy, Patient/Family education, Stair training, Taping, Joint mobilization, Vestibular training, Cryotherapy, and Moist heat  PLAN FOR NEXT SESSION: Initiate general strengthening HEP. Work on balance/ankle and hip stability.    Shaconda Hajduk April Ma L Charlene Cowdrey, PT 03/17/2023, 2:28 PM

## 2023-03-21 ENCOUNTER — Ambulatory Visit: Payer: Medicare Other

## 2023-03-21 DIAGNOSIS — R293 Abnormal posture: Secondary | ICD-10-CM

## 2023-03-21 DIAGNOSIS — R2681 Unsteadiness on feet: Secondary | ICD-10-CM

## 2023-03-21 DIAGNOSIS — M6281 Muscle weakness (generalized): Secondary | ICD-10-CM | POA: Diagnosis not present

## 2023-03-21 NOTE — Therapy (Signed)
 OUTPATIENT PHYSICAL THERAPY LOWER EXTREMITY TREATMENT   Patient Name: Darren Frank MRN: [id number] DOB:December 13, 1931, 89 y.o., male Today's Date: 03/24/2023  END OF SESSION:  PT End of Session - 03/24/23 0930     Visit Number 9    Date for PT Re-Evaluation 03/24/23    Authorization Type [employer]    Progress Note Due on Visit 10    PT Start Time 0930    PT Stop Time 1010    PT Time Calculation (min) 40 min    Activity Tolerance Patient tolerated treatment well    Behavior During Therapy WFL for tasks assessed/performed                 Past Medical History:  Diagnosis Date   Anal polyp    Bradycardia found on measurement of baseline fetal heart rate    Diplopia    Diverticulosis    Hearing loss    High cholesterol    History of radiation therapy    Prostate, done at    Lymph node cancer (HCC)    Lymphoma in remission    OA (osteoarthritis)    knees   Obese    Peripheral neuropathy    Prostate cancer (HCC)    Tinnitus    Vertigo    Past Surgical History:  Procedure Laterality Date   BACK SURGERY     diskectomy   CATARACT EXTRACTION Right    COLONOSCOPY  04/06/2000   no more due to radiation treatment to prostate/lower bowel   lymphatic surgery, left groin     TONSILLECTOMY     Patient Active Problem List   Diagnosis Date Noted   Fever 04/04/2011   Neutropenia, unspecified (HCC) 04/04/2011   Prostate cancer (HCC)    Lymph node cancer (HCC)     PCP: Clarice Nottingham, MD  REFERRING PROVIDER: Clarice Nottingham, MD  REFERRING DIAG: R26.9 (ICD-10-CM) - Unspecified abnormalities of gait and mobility  THERAPY DIAG:  Muscle weakness (generalized)  Abnormal posture  Unsteadiness on feet  Other abnormalities of gait and mobility  Rationale for Evaluation and Treatment: Rehabilitation  ONSET DATE: Chronic >10 years  SUBJECTIVE:   SUBJECTIVE STATEMENT: I am doing good.   PERTINENT HISTORY: Cancer, partial lumbar discectomy PAIN:  Are you  having pain? No  PRECAUTIONS: None  RED FLAGS: None   WEIGHT BEARING RESTRICTIONS: No  FALLS:  Has patient fallen in last 6 months? No  LIVING ENVIRONMENT: Lives with: lives with their spouse and lives with their daughter Lives in: House/apartment Stairs: Yes: Internal: 12-13 steps; on right going up Has following equipment at home: Single point cane  OCCUPATION: Retired - likes to watch football, play cards  PLOF: Independent  PATIENT GOALS: Get out of a chair  NEXT MD VISIT: n/a  OBJECTIVE:  Note: Objective measures were completed at Evaluation unless otherwise noted.  DIAGNOSTIC FINDINGS: Nothing recent on Epic  PATIENT SURVEYS:  Lower Extremity Functional Score: 31 / 80 = 38.8 %  EDEMA:  Bilat LE edema evident (states he bought compression stockings)  MUSCLE LENGTH: Did not assess  POSTURE: rounded shoulders, forward head, and flexed trunk   LOWER EXTREMITY ROM:  Active ROM Right eval Left eval  Hip flexion    Hip extension    Hip abduction    Hip adduction    Hip internal rotation    Hip external rotation    Knee flexion    Knee extension    Ankle dorsiflexion    Ankle plantarflexion  Ankle inversion    Ankle eversion     (Blank rows = not tested)  LOWER EXTREMITY MMT:  MMT Right eval Left eval  Hip flexion 3+ 3+  Hip extension 3 3  Hip abduction 3 3  Hip adduction    Hip internal rotation    Hip external rotation    Knee flexion 4 5  Knee extension 5 5  Ankle dorsiflexion 4 4+  Ankle plantarflexion    Ankle inversion    Ankle eversion     (Blank rows = not tested)  LOWER EXTREMITY SPECIAL TESTS:  Unable to assess  TRANSFERS:  Sit to stand: SBA Has to use UEs, braces LEs against table  Stand to sit: SBA has to use UEs  Sit to sidelying: Min A for LE negotiation; left sidelying limited due to pt dizziness  Sidelying to sit: Min A for trunk negotiation  Sit to supine: Pt unable to perform (pt states he cannot tolerate  supine)  Rolling L<>R: Pt states he is unable to perform    FUNCTIONAL TESTS:  5 times sit to stand: 29.31 sec with UE support and bracing LEs against mat table  (eval) 24.41 sec with UE support (02/24/23) TUG: 17.94 sec (eval)  GAIT: Distance walked: Into clinic Assistive device utilized: Single point cane Level of assistance: CGA Comments: diminished heel strike bilaterally (R worse than L), decreased step length bilaterally, forward flexed trunk   TODAY'S TREATMENT:                                                                                                                              DATE:  03/24/23 Recheck goals  NuStep L5x79mins  Shoulder ext 10# 2x10  Calf raises 2x10 STS with chest press yellow 2x8  03/21/23 NuStep L 5 x S2S 2x8 from elevated mat Fitter press 2 blue bands 2x10 HS curls 45lb 2x12 Leg Ext 10lb 2x12 6in step ups forward 1 handrail x10 each side   03/17/23 Nustep L6 x 5 min Wall slide 2x10 Modified deadlift with cues for hip hinge in staggered standing x10 Standing hip ext iso against wall 10x5  Forearm wall plank 2x20 Forearm wall plank with arm raises 2x5 Standing against wall scap squeeze 2x10 Staggered stance 2x20 Single leg stance with intermittent LE support 2x10   02/24/23 Nustep L5 x 5 min Partial sit to stands with holds 5x5 Counter mini squats x10 Forward step up 4 x10 with bilat UE support, 6 x10 with bilat UE support Side step up 4 x10 with UE support Slow marching x10 Hip ext yellow TB 2x10 Hip abd yellow TB 2x10 Staggered stance with arms overhead x10 R&L  02/21/23 NuStep L 5 x 7 min HS curls 45lb 2x12 Leg Ext 10lb 2x12 S2S 2x10 elevated mat  Fitter press 1 blue 1 black 2x15 6in step ups forward and lateral 1 rail x10  Seated OHP yellow ball 2x10  02/11/23 NuStep L 5 x  6 min Performed all HEP interventions 10 reps  S2S 2x10 elevated mat  4in step ups x 10 each 1 rail, then 6in x10 Rows & Ext green 2x10   HS curls 45lb 2x10 Leg Ext 10lb 2x10  02/08/23 NuStep L 4 x 6 min Resisted gait 20lb 4 way x 3 each HS curls 45lb 2x10 Leg Ext 10lb 2x10 Sit to stands from elevated mat some without UE 2x10 Alt 6in box taps w/ SPC 2x10  02/01/23 NuStep L 4 x 6 min S2S 2x10 UE on knees elevated mat  HS curls blue 2x10 LAQ 3lb 2x15 Ankle PF/DF blue x15, x10 4in step ups x10, 6 in step ups x10 with both rails Hip add ball squeeze  01/27/23  Did not initiate  PATIENT EDUCATION:  Education details: Exam findings, POC Person educated: Patient Education method: Explanation and Demonstration Education comprehension: verbalized understanding and needs further education  HOME EXERCISE PROGRAM: Access Code: [email or web address] URL: [email or web address] Date: 02/11/2023 Prepared by: Tanda Sorrow  Exercises - Sit to Stand with Hands on Knees  - 1 x daily - 7 x weekly - 3 sets - 10 reps - Standing March with Unilateral Counter Support  - 1 x daily - 7 x weekly - 3 sets - 10 reps - Standing Hip Extension with Unilateral Counter Support  - 1 x daily - 7 x weekly - 3 sets - 10 reps - Standing Knee Flexion with Unilateral Counter Support  - 1 x daily - 7 x weekly - 3 sets - 10 reps  ASSESSMENT:  CLINICAL IMPRESSION: Patient is a 89 y.o. M who was seen today for physical therapy treatment for bilat LE weakness. PMH significant for dizziness, cancer, and prior discectomy. He reports he is trying to do his HEP and will try to get in to a routine.  Reviewed his goals, and he has met all of them. Patient states he feels good about it being his last visit and he will continue working at home. We also went over his HEP to make sure he knows how to do them. His biggest concerns are dizziness associated with a history of vertigo, and getting up from low surfaces but that has gotten easier for him since starting PT.   OBJECTIVE IMPAIRMENTS: Abnormal gait, decreased activity tolerance, decreased balance,  decreased endurance, decreased mobility, difficulty walking, decreased ROM, decreased strength, dizziness, increased edema, impaired sensation, improper body mechanics, and postural dysfunction.     GOALS: Goals reviewed with patient? Yes  SHORT TERM GOALS: Target date: 02/25/2023  Pt will be ind with initial HEP Baseline: Goal status: Met 02/21/23  2.  Pt will be able to perform 5x STS in </=25 sec for improving transfers and functional LE strength Baseline:  Goal status: Met 02/24/23    LONG TERM GOALS: Target date: 03/25/2023   Pt will be ind with progression and management of HEP Baseline:  Goal status: MET 03/24/23  2.  Pt will improve TUG to </=13 sec for reduced fall risk Baseline:  Goal status: 12.19s MET 03/24/23  3.  Pt will have improved 5x STS to </=20 sec to demo increasing functional LE strength Baseline:  Goal status: 18s 03/24/23  4.  Pt will have improved LEFS score to >/=48.8% to demo MCID Baseline: 38.8% Goal status: 56.3% MET 03/24/23  5.  Pt will report >/=50% improvement in his sit to stand transfers per his goal Baseline:  Goal status: MET 03/24/23  PLAN:  PT FREQUENCY: 2x/week  PT DURATION: 8  weeks  PLANNED INTERVENTIONS: [id number]- PT Re-evaluation, 97110-Therapeutic exercises, 97530- Therapeutic activity, [id number]- Neuromuscular re-education, 97535- Self Care, [id number]- Manual therapy, [phone]- Gait training, [phone]- Canalith repositioning, [phone]- Aquatic Therapy, Patient/Family education, Stair training, Taping, Joint mobilization, Vestibular training, Cryotherapy, and Moist heat  PLAN FOR NEXT SESSION: d/c   Almetta Fam, PT 03/24/2023, 10:10 AM

## 2023-03-24 ENCOUNTER — Ambulatory Visit: Payer: Medicare Other | Attending: Internal Medicine

## 2023-03-24 DIAGNOSIS — R293 Abnormal posture: Secondary | ICD-10-CM | POA: Diagnosis present

## 2023-03-24 DIAGNOSIS — R2681 Unsteadiness on feet: Secondary | ICD-10-CM | POA: Diagnosis present

## 2023-03-24 DIAGNOSIS — M6281 Muscle weakness (generalized): Secondary | ICD-10-CM | POA: Insufficient documentation

## 2023-03-24 DIAGNOSIS — R2689 Other abnormalities of gait and mobility: Secondary | ICD-10-CM | POA: Insufficient documentation
